# Patient Record
Sex: Female | Born: 1940 | Race: White | Hispanic: No | Smoking: Never smoker
Health system: Southern US, Community
[De-identification: ages and names within clinical notes are randomized; demographics above are authoritative.]

## PROBLEM LIST (undated history)

## (undated) DIAGNOSIS — E119 Type 2 diabetes mellitus without complications: Secondary | ICD-10-CM

## (undated) DIAGNOSIS — D329 Benign neoplasm of meninges, unspecified: Secondary | ICD-10-CM

## (undated) DIAGNOSIS — I2510 Atherosclerotic heart disease of native coronary artery without angina pectoris: Secondary | ICD-10-CM

## (undated) DIAGNOSIS — G4730 Sleep apnea, unspecified: Secondary | ICD-10-CM

## (undated) DIAGNOSIS — G629 Polyneuropathy, unspecified: Secondary | ICD-10-CM

## (undated) DIAGNOSIS — D126 Benign neoplasm of colon, unspecified: Secondary | ICD-10-CM

## (undated) DIAGNOSIS — E785 Hyperlipidemia, unspecified: Secondary | ICD-10-CM

## (undated) DIAGNOSIS — R197 Diarrhea, unspecified: Secondary | ICD-10-CM

## (undated) DIAGNOSIS — D509 Iron deficiency anemia, unspecified: Secondary | ICD-10-CM

## (undated) DIAGNOSIS — K219 Gastro-esophageal reflux disease without esophagitis: Secondary | ICD-10-CM

## (undated) DIAGNOSIS — I472 Ventricular tachycardia: Secondary | ICD-10-CM

## (undated) DIAGNOSIS — L570 Actinic keratosis: Secondary | ICD-10-CM

## (undated) DIAGNOSIS — C801 Malignant (primary) neoplasm, unspecified: Secondary | ICD-10-CM

## (undated) DIAGNOSIS — R1310 Dysphagia, unspecified: Secondary | ICD-10-CM

## (undated) DIAGNOSIS — K279 Peptic ulcer, site unspecified, unspecified as acute or chronic, without hemorrhage or perforation: Secondary | ICD-10-CM

## (undated) DIAGNOSIS — I10 Essential (primary) hypertension: Secondary | ICD-10-CM

## (undated) DIAGNOSIS — M5090 Cervical disc disorder, unspecified, unspecified cervical region: Secondary | ICD-10-CM

## (undated) DIAGNOSIS — I4729 Other ventricular tachycardia: Secondary | ICD-10-CM

## (undated) HISTORY — PX: COLONOSCOPY: SHX174

## (undated) HISTORY — DX: Actinic keratosis: L57.0

## (undated) HISTORY — PX: ABDOMINAL HYSTERECTOMY: SHX81

## (undated) HISTORY — PX: ESOPHAGOGASTRODUODENOSCOPY: SHX1529

---

## 2004-03-29 ENCOUNTER — Ambulatory Visit: Payer: Self-pay | Admitting: Gastroenterology

## 2004-05-02 ENCOUNTER — Ambulatory Visit: Payer: Self-pay | Admitting: Internal Medicine

## 2004-12-20 ENCOUNTER — Ambulatory Visit: Payer: Self-pay | Admitting: Internal Medicine

## 2005-12-26 ENCOUNTER — Ambulatory Visit: Payer: Self-pay | Admitting: Internal Medicine

## 2006-01-04 ENCOUNTER — Ambulatory Visit: Payer: Self-pay | Admitting: Gastroenterology

## 2006-02-19 ENCOUNTER — Ambulatory Visit: Payer: Self-pay | Admitting: Internal Medicine

## 2006-05-26 ENCOUNTER — Ambulatory Visit: Payer: Self-pay | Admitting: Internal Medicine

## 2006-12-31 ENCOUNTER — Ambulatory Visit: Payer: Self-pay | Admitting: Internal Medicine

## 2007-08-17 ENCOUNTER — Ambulatory Visit: Payer: Self-pay | Admitting: Unknown Physician Specialty

## 2008-01-08 ENCOUNTER — Ambulatory Visit: Payer: Self-pay | Admitting: Internal Medicine

## 2008-06-10 ENCOUNTER — Ambulatory Visit: Payer: Self-pay | Admitting: Cardiology

## 2008-08-18 ENCOUNTER — Ambulatory Visit: Payer: Self-pay | Admitting: Gastroenterology

## 2009-01-08 ENCOUNTER — Ambulatory Visit: Payer: Self-pay | Admitting: Internal Medicine

## 2009-02-11 ENCOUNTER — Ambulatory Visit: Payer: Self-pay | Admitting: Gastroenterology

## 2009-05-04 ENCOUNTER — Ambulatory Visit: Payer: Self-pay | Admitting: Internal Medicine

## 2009-05-11 ENCOUNTER — Ambulatory Visit: Payer: Self-pay | Admitting: Gastroenterology

## 2009-12-16 ENCOUNTER — Ambulatory Visit: Payer: Self-pay | Admitting: Ophthalmology

## 2009-12-28 ENCOUNTER — Ambulatory Visit: Payer: Self-pay | Admitting: Ophthalmology

## 2010-01-11 ENCOUNTER — Ambulatory Visit: Payer: Self-pay | Admitting: Internal Medicine

## 2010-01-27 ENCOUNTER — Ambulatory Visit: Payer: Self-pay | Admitting: Ophthalmology

## 2010-02-08 ENCOUNTER — Ambulatory Visit: Payer: Self-pay | Admitting: Ophthalmology

## 2011-02-15 ENCOUNTER — Ambulatory Visit: Payer: Self-pay | Admitting: Internal Medicine

## 2011-09-26 ENCOUNTER — Ambulatory Visit: Payer: Self-pay | Admitting: Internal Medicine

## 2012-01-22 ENCOUNTER — Ambulatory Visit: Payer: Self-pay | Admitting: Internal Medicine

## 2012-01-22 LAB — CREATININE, SERUM: EGFR (Non-African Amer.): 53 — ABNORMAL LOW

## 2012-02-16 ENCOUNTER — Ambulatory Visit: Payer: Self-pay | Admitting: Internal Medicine

## 2012-09-11 ENCOUNTER — Ambulatory Visit: Payer: Self-pay | Admitting: Internal Medicine

## 2013-02-17 ENCOUNTER — Ambulatory Visit: Payer: Self-pay | Admitting: Internal Medicine

## 2013-09-17 DIAGNOSIS — D329 Benign neoplasm of meninges, unspecified: Secondary | ICD-10-CM | POA: Insufficient documentation

## 2013-09-17 DIAGNOSIS — G4733 Obstructive sleep apnea (adult) (pediatric): Secondary | ICD-10-CM | POA: Insufficient documentation

## 2013-10-07 ENCOUNTER — Ambulatory Visit: Payer: Self-pay | Admitting: Gastroenterology

## 2013-10-09 LAB — PATHOLOGY REPORT

## 2013-11-11 ENCOUNTER — Ambulatory Visit: Payer: Self-pay | Admitting: Gastroenterology

## 2014-02-23 ENCOUNTER — Ambulatory Visit: Payer: Self-pay | Admitting: Internal Medicine

## 2015-01-07 ENCOUNTER — Other Ambulatory Visit: Payer: Self-pay | Admitting: Internal Medicine

## 2015-01-07 DIAGNOSIS — Z1231 Encounter for screening mammogram for malignant neoplasm of breast: Secondary | ICD-10-CM

## 2015-03-01 ENCOUNTER — Other Ambulatory Visit: Payer: Self-pay | Admitting: Internal Medicine

## 2015-03-01 ENCOUNTER — Ambulatory Visit
Admission: RE | Admit: 2015-03-01 | Discharge: 2015-03-01 | Disposition: A | Discharge status: Home / Self Care | Payer: Medicare Other | Source: Ambulatory Visit | Attending: Internal Medicine | Admitting: Internal Medicine

## 2015-03-01 DIAGNOSIS — Z1231 Encounter for screening mammogram for malignant neoplasm of breast: Secondary | ICD-10-CM | POA: Diagnosis not present

## 2015-03-01 HISTORY — DX: Malignant (primary) neoplasm, unspecified: C80.1

## 2015-12-20 ENCOUNTER — Other Ambulatory Visit: Payer: Self-pay | Admitting: Internal Medicine

## 2015-12-20 DIAGNOSIS — D329 Benign neoplasm of meninges, unspecified: Secondary | ICD-10-CM

## 2015-12-20 DIAGNOSIS — G450 Vertebro-basilar artery syndrome: Secondary | ICD-10-CM

## 2015-12-29 ENCOUNTER — Ambulatory Visit
Admission: RE | Admit: 2015-12-29 | Discharge: 2015-12-29 | Disposition: A | Discharge status: Home / Self Care | Payer: Medicare Other | Source: Ambulatory Visit | Attending: Internal Medicine | Admitting: Internal Medicine

## 2015-12-29 DIAGNOSIS — I6782 Cerebral ischemia: Secondary | ICD-10-CM | POA: Diagnosis not present

## 2015-12-29 DIAGNOSIS — I608 Other nontraumatic subarachnoid hemorrhage: Secondary | ICD-10-CM | POA: Insufficient documentation

## 2015-12-29 DIAGNOSIS — D329 Benign neoplasm of meninges, unspecified: Secondary | ICD-10-CM | POA: Insufficient documentation

## 2015-12-29 DIAGNOSIS — G450 Vertebro-basilar artery syndrome: Secondary | ICD-10-CM

## 2016-01-19 ENCOUNTER — Other Ambulatory Visit: Payer: Self-pay | Admitting: Otolaryngology

## 2016-01-19 DIAGNOSIS — R1310 Dysphagia, unspecified: Secondary | ICD-10-CM

## 2016-01-26 ENCOUNTER — Ambulatory Visit: Payer: Medicare Other | Admitting: Physical Therapy

## 2016-02-02 ENCOUNTER — Encounter: Payer: Medicare Other | Admitting: Physical Therapy

## 2016-02-03 ENCOUNTER — Ambulatory Visit
Admission: RE | Admit: 2016-02-03 | Discharge: 2016-02-03 | Disposition: A | Discharge status: Home / Self Care | Payer: Medicare Other | Source: Ambulatory Visit | Attending: Otolaryngology | Admitting: Otolaryngology

## 2016-02-03 DIAGNOSIS — R1310 Dysphagia, unspecified: Secondary | ICD-10-CM | POA: Insufficient documentation

## 2016-02-03 NOTE — Therapy (Addendum)
[place], [place], [address] Phone: [phone]   Fax:     Modified Barium Swallow  Patient Details  Name: Brandy Oliver MRN: [id number] Date of Birth: 01/01/1941 No Data Recorded  Encounter Date: 02/03/2016   Subjective: Patient behavior: (alertness, ability to follow instructions, etc.): pt c/o difficulty w/ swallowing saliva stating it felt her throat would "lock up sometimes" the harder she tried to swallow. She stated she had dry mouth "all the time" and addresses that w/ frequent water drinking. She denied any significant issues or events of difficulty swallowing foods or liquids. Pt wears full dentures. Chief complaint: dysphagia   Objective:  Radiological Procedure: A videoflouroscopic evaluation of oral-preparatory, reflex initiation, and pharyngeal phases of the swallow was performed; as well as a screening of the upper esophageal phase.  I. POSTURE: upright II. VIEW: lateral III. COMPENSATORY STRATEGIES: IV. BOLUSES ADMINISTERED:  Thin Liquid: 5 trials(w/ multiple swallowing)  Nectar-thick Liquid: 1 trial  Honey-thick Liquid: NT  Puree: 3 trials  Mechanical Soft: 1 trial V. RESULTS OF EVALUATION: A. ORAL PREPARATORY PHASE: (The lips, tongue, and velum are observed for strength and coordination)       **Overall Severity Rating: WFL. Pt exhibited timely oral phase management and A-P transfer for swallowing; oral clearing noted post swallow.  B. SWALLOW INITIATION/REFLEX: (The reflex is normal if "triggered" by the time the bolus reached the base of the tongue)  **Overall Severity Rating: [facility]. Pt exhibited a timely pharyngeal swallow initiation w/ all trial consistencies. Adequate airway closure timing noted.   C. PHARYNGEAL PHASE: (Pharyngeal function is normal if the bolus shows rapid, smooth, and continuous transit through the pharynx and there is no pharyngeal residue after the  swallow)  **Overall Severity Rating: [facility]. No pharyngeal reside remained post swallows indicating adequate pharyngeal pressure and laryngeal excursion during the swallowing.  D. LARYNGEAL PENETRATION: (Material entering into the laryngeal inlet/vestibule but not aspirated): NONE E. ASPIRATION: NONE F. ESOPHAGEAL PHASE: (Screening of the upper esophagus): Bolus motility through the cervical Esophagus appeared adequate; no residue remained. However, noted min prominent cricopharyngeus muscle w/ what appeared to be the early beginning of a cricopharyngeus bar. This could be resulting from pt's report of the effortful, hard swallowing she does when her mouth is dry.   ASSESSMENT: Pt appeared to present w/  functional oropharyngeal phase swallowing w/ no apparent oropharyngeal phase deficits noted during this exam. Oral phase was appropriate for bolus management and clearing. During the pharyngeal phase, timing of the pharyngeal swallow was appropriate w/ adequate airway closure. No laryngeal penetration or aspiration was noted during this exam, and no pharyngeal residue remained post swallowing.  Of note, pt exhibited what appeared to be a slightly prominent cricopharyngeus muscle w/ early beginning of a cricopharyngeus bar. Pt did state she uses effortful swallowing frequently in attempts to swallow "make myself swallow when I have no saliva". This could impact this presentation.  Thorough education given to pt on oral care and moistening seeking products to aid in moistening her mouth such as Biotene, avoiding heavy mints, frequent oral care, hydration, and possible use of Vaporizer in the bedroom at night while sleeping as pt reports she is a mouth breather. Encouraged her to relax and breath when she feels she becomes tense about her swallowing(of saliva). Strongly suggested pt f/u w/ her Pharmacist to investigate her medications that may be contributing to her dry mouth.    PLAN/RECOMMENDATIONS:  A.  Diet: Regular;  thin liquids. Moisten foods well. Have liquids w/ each meal.   B. Swallowing Precautions: general aspiration precautions.  C. Recommended consultation to: f/u w/ ENT or dentist re: dry mouth; Pharmacist re: medication consultation for any medications increasing potential for dry mouth..   D. Therapy recommendations: none at this time  E. Results and recommendations were discussed w/ pt; video viewed. Recommendations given. Pt able to verbally recall recommendations.      End of Session - 02-28-2016 1458    Visit Number 1   Number of Visits 1   Date for SLP Re-Evaluation February 28, 2016   SLP Start Time 1300   SLP Stop Time  1400   SLP Time Calculation (min) 60 min   Activity Tolerance Patient tolerated treatment well      Past Medical History:  Diagnosis Date  . Cancer ([place])    skin     No past surgical history on file.  There were no vitals filed for this visit.              Dysphagia, unspecified type - Plan: DG OP Swallowing Func-Medicare/Speech Path, DG OP Swallowing Func-Medicare/Speech Path      G-Codes - 2016-02-28 1458    Functional Assessment Tool Used clinical judgement   Functional Limitations Swallowing   Swallow Current Status [id number]) At least 1 percent but less than 20 percent impaired, limited or restricted   Swallow Goal Status [id number]) At least 1 percent but less than 20 percent impaired, limited or restricted   Swallow Discharge Status [phone]) At least 1 percent but less than 20 percent impaired, limited or restricted          Problem List There are no active problems to display for this patient.     Orinda Kenner, [place], CCC-SLP [place] Feb 28, 2016, 2:59 PM  [facility] [place], [place], [address] Phone: [phone]   Fax:     Name: Brandy Oliver MRN: [id number] Date of Birth: [birth year]

## 2016-02-09 ENCOUNTER — Encounter: Payer: Medicare Other | Admitting: Physical Therapy

## 2016-02-14 ENCOUNTER — Other Ambulatory Visit: Payer: Self-pay | Admitting: Internal Medicine

## 2016-02-14 DIAGNOSIS — Z1231 Encounter for screening mammogram for malignant neoplasm of breast: Secondary | ICD-10-CM

## 2016-02-18 ENCOUNTER — Encounter: Payer: Medicare Other | Admitting: Physical Therapy

## 2016-02-25 ENCOUNTER — Encounter: Payer: Medicare Other | Admitting: Physical Therapy

## 2016-03-07 ENCOUNTER — Encounter: Payer: Medicare Other | Admitting: Physical Therapy

## 2016-03-16 ENCOUNTER — Ambulatory Visit
Admission: RE | Admit: 2016-03-16 | Discharge: 2016-03-16 | Disposition: A | Discharge status: Home / Self Care | Payer: Medicare Other | Source: Ambulatory Visit | Attending: Internal Medicine | Admitting: Internal Medicine

## 2016-03-16 DIAGNOSIS — Z1231 Encounter for screening mammogram for malignant neoplasm of breast: Secondary | ICD-10-CM | POA: Insufficient documentation

## 2016-03-17 ENCOUNTER — Encounter: Payer: Medicare Other | Admitting: Physical Therapy

## 2016-07-21 ENCOUNTER — Other Ambulatory Visit: Payer: Self-pay | Admitting: Nurse Practitioner

## 2016-07-21 DIAGNOSIS — R1319 Other dysphagia: Secondary | ICD-10-CM

## 2016-07-26 ENCOUNTER — Other Ambulatory Visit: Payer: Self-pay | Admitting: Nurse Practitioner

## 2016-07-26 ENCOUNTER — Ambulatory Visit: Admission: RE | Admit: 2016-07-26 | Payer: Medicare Other | Source: Ambulatory Visit

## 2016-07-26 ENCOUNTER — Ambulatory Visit
Admission: RE | Admit: 2016-07-26 | Discharge: 2016-07-26 | Disposition: A | Discharge status: Home / Self Care | Payer: Medicare Other | Source: Ambulatory Visit | Attending: Nurse Practitioner | Admitting: Nurse Practitioner

## 2016-07-26 DIAGNOSIS — R1319 Other dysphagia: Secondary | ICD-10-CM | POA: Insufficient documentation

## 2016-07-26 DIAGNOSIS — K219 Gastro-esophageal reflux disease without esophagitis: Secondary | ICD-10-CM | POA: Diagnosis not present

## 2016-11-30 DIAGNOSIS — C4492 Squamous cell carcinoma of skin, unspecified: Secondary | ICD-10-CM

## 2016-11-30 HISTORY — DX: Squamous cell carcinoma of skin, unspecified: C44.92

## 2017-03-07 ENCOUNTER — Other Ambulatory Visit: Payer: Self-pay | Admitting: Internal Medicine

## 2017-03-07 DIAGNOSIS — Z1231 Encounter for screening mammogram for malignant neoplasm of breast: Secondary | ICD-10-CM

## 2017-03-30 ENCOUNTER — Ambulatory Visit
Admission: RE | Admit: 2017-03-30 | Discharge: 2017-03-30 | Disposition: A | Discharge status: Home / Self Care | Payer: Medicare Other | Source: Ambulatory Visit | Attending: Internal Medicine | Admitting: Internal Medicine

## 2017-03-30 DIAGNOSIS — Z1231 Encounter for screening mammogram for malignant neoplasm of breast: Secondary | ICD-10-CM | POA: Diagnosis not present

## 2018-02-26 ENCOUNTER — Other Ambulatory Visit: Payer: Self-pay | Admitting: Internal Medicine

## 2018-02-26 DIAGNOSIS — Z1231 Encounter for screening mammogram for malignant neoplasm of breast: Secondary | ICD-10-CM

## 2018-04-01 ENCOUNTER — Ambulatory Visit
Admission: RE | Admit: 2018-04-01 | Discharge: 2018-04-01 | Disposition: A | Discharge status: Home / Self Care | Payer: Medicare Other | Source: Ambulatory Visit | Attending: Internal Medicine | Admitting: Internal Medicine

## 2018-04-01 DIAGNOSIS — Z1231 Encounter for screening mammogram for malignant neoplasm of breast: Secondary | ICD-10-CM | POA: Insufficient documentation

## 2018-04-21 IMAGING — RF DG SWALLOWING FUNCTION
9 series · 13 of 24 positions shown · non-contrast
Comparison: None.

CLINICAL DATA: 75-year-old female with dry mouth and difficulty
swallowing.

EXAM:
MODIFIED BARIUM SWALLOW
TECHNIQUE: Different consistencies of barium were administered orally to the
patient by the Speech Pathologist. Imaging of the pharynx was
performed in the lateral projection.
FLUOROSCOPY TIME:  Fluoroscopy Time:  30 seconds
Number of Acquired Spot Images: 0

[Series 1: run · 2 of 46 frames shown (1 of 9)]
[frame 7/46]
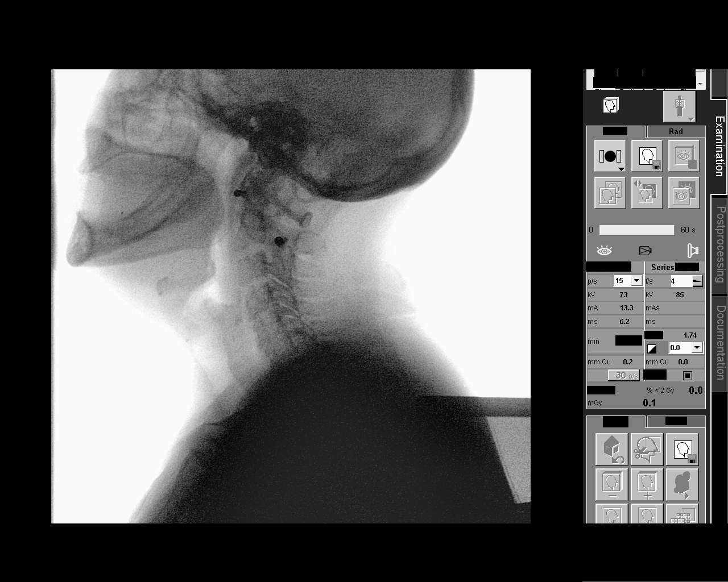
[frame 40/46]
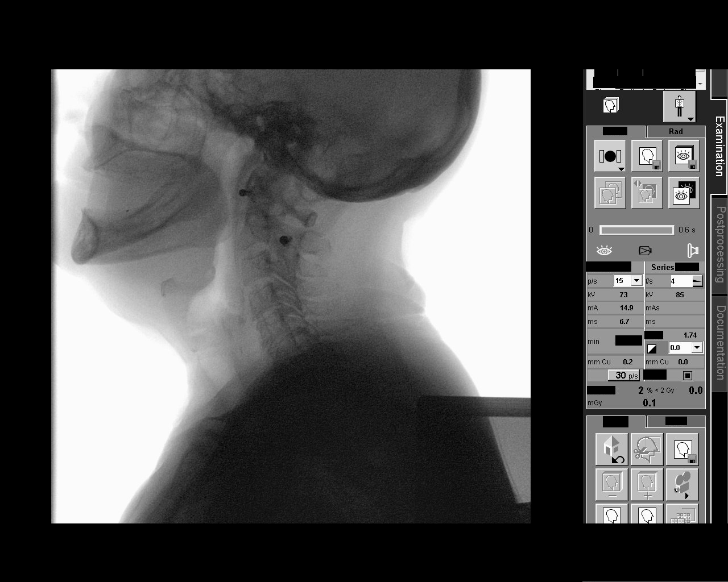

[Series 2: run · 1 of 41 frames shown (2 of 9)]
[frame 35/41]
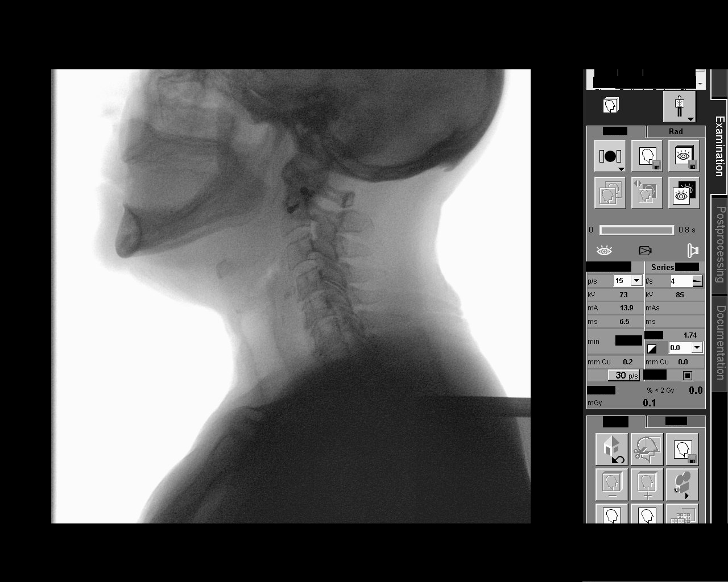

[Series 3: run · 1 of 97 frames shown (3 of 9)]
[frame 49/97]
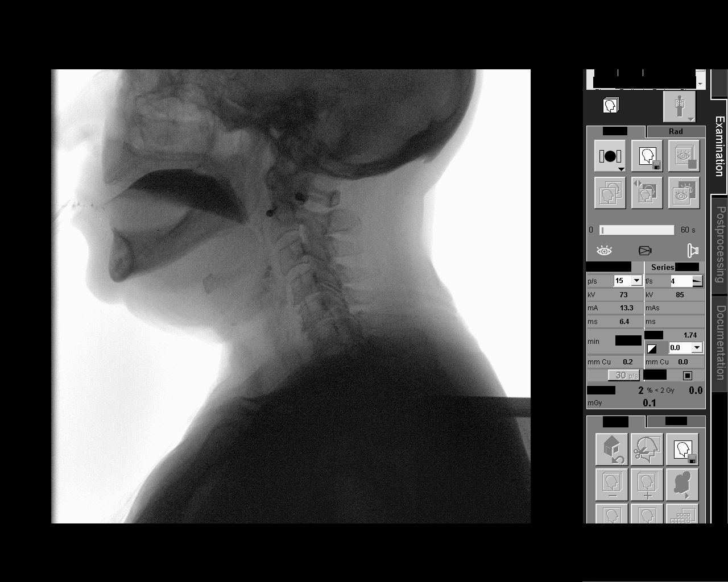

[Series 4: run · 2 of 30 frames shown (4 of 9)]
[frame 5/30]
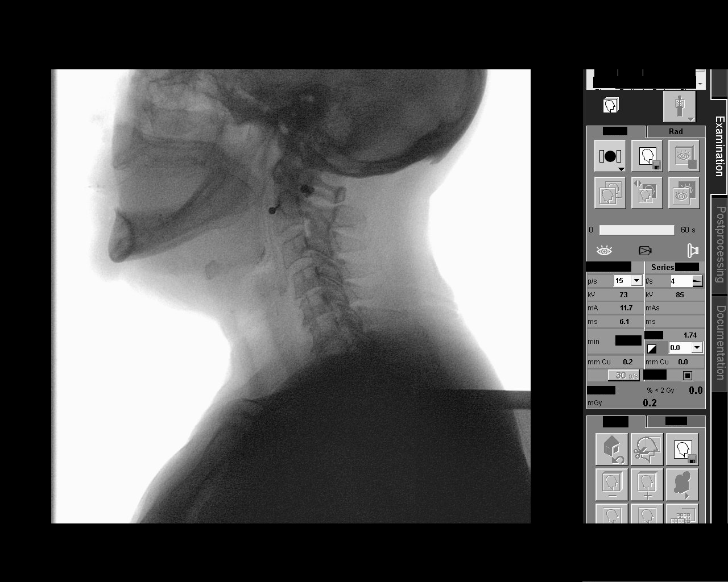
[frame 26/30]
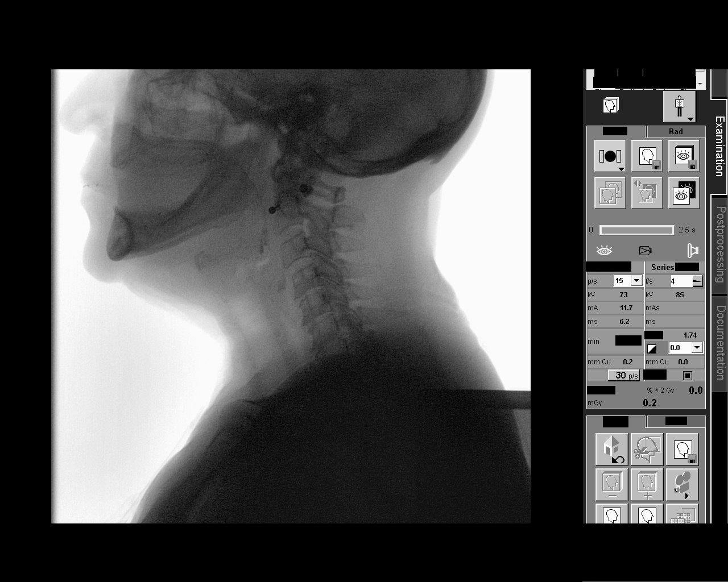

[Series 5: run · 1 of 34 frames shown (5 of 9)]
[frame 21/34]
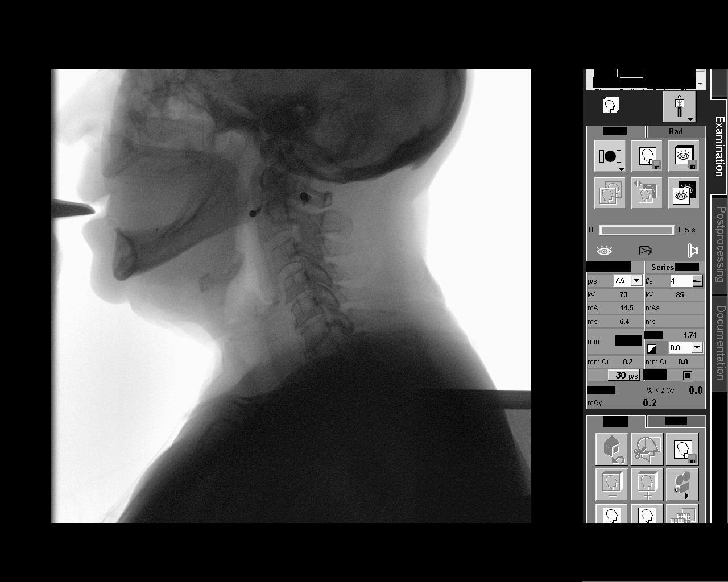

[Series 6: run · 2 of 83 frames shown (6 of 9)]
[frame 13/83]
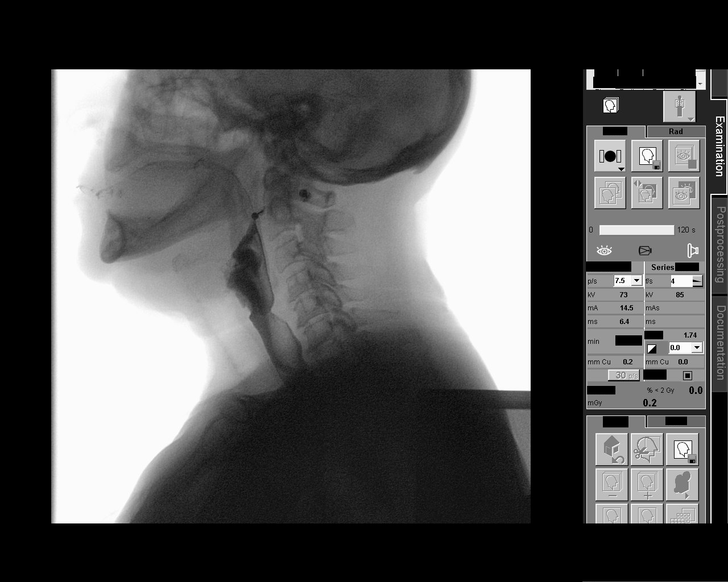
[frame 71/83]
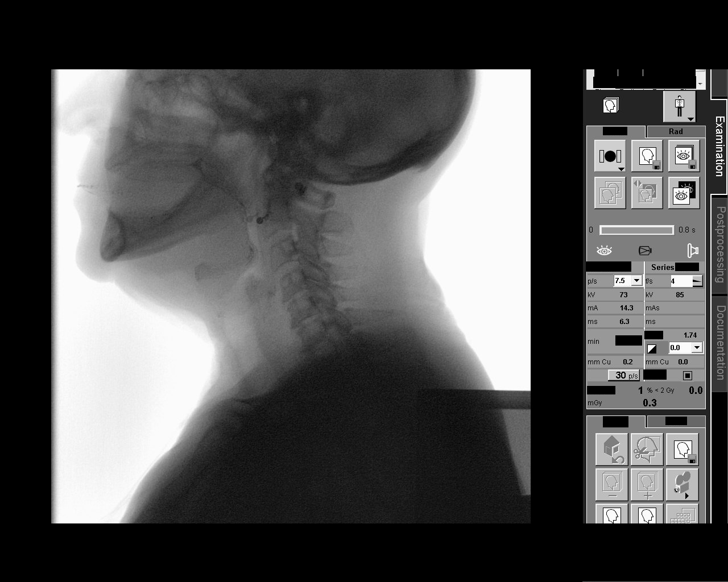

[Series 7: run · 1 of 134 frames shown (7 of 9)]
[frame 68/134]
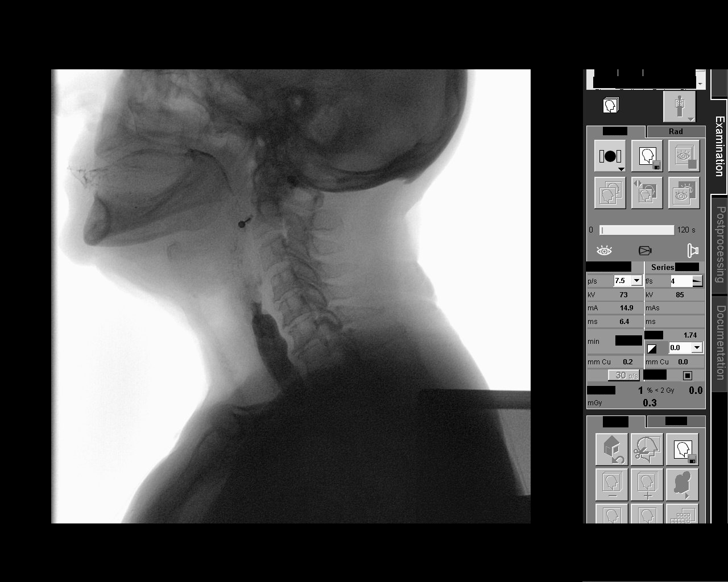

[Series 8: run · 1 of 106 frames shown (8 of 9)]
[frame 29/106]
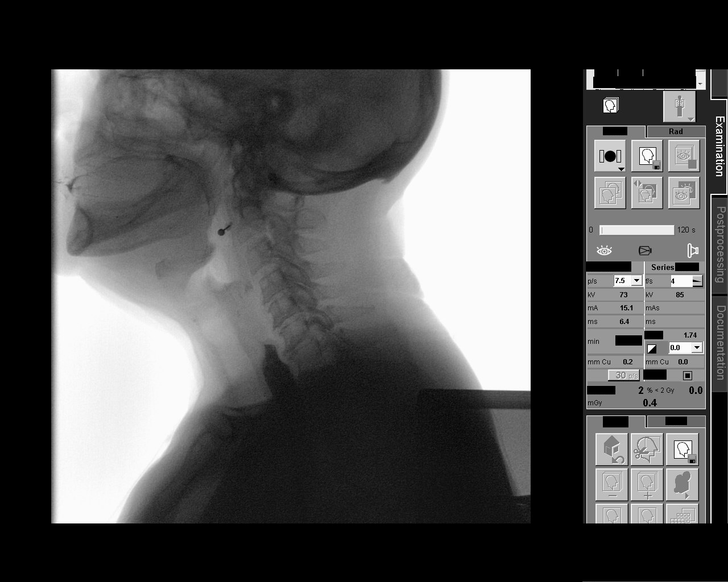

[Series 9: run · 2 of 296 frames shown (9 of 9)]
[frame 45/296]
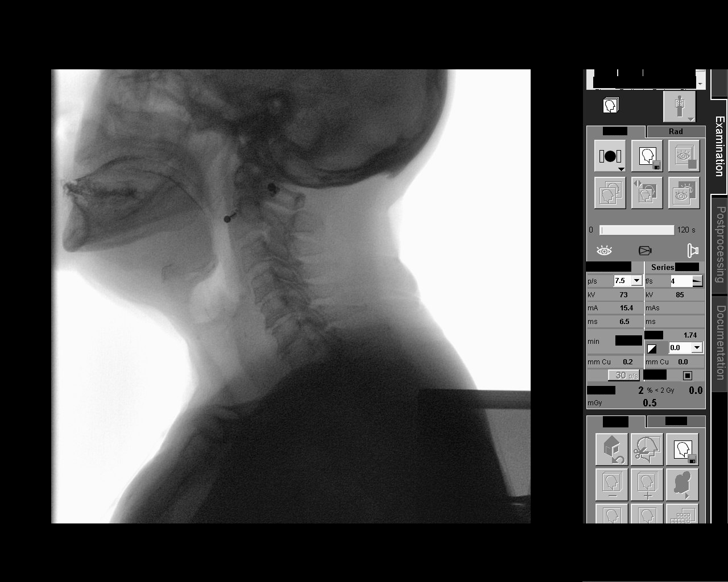
[frame 252/296]
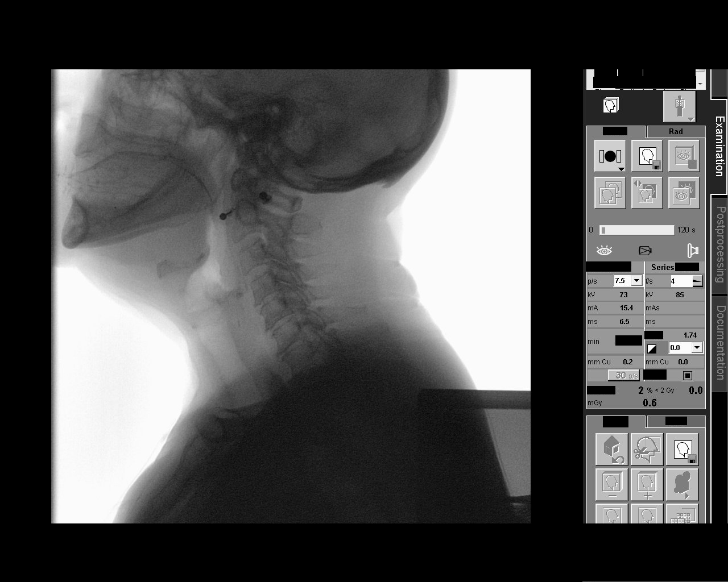

[13 of 24 positions shown; findings below may reference images not displayed]

FINDINGS: Thin liquid- no laryngeal penetration or aspiration. Slightly
prominent cricopharyngeal muscle. Minimal impression posterior
aspect of the cervical esophagus by C6-7 osteophyte.

Nectar thick liquid- no laryngeal penetration or aspiration.
Slightly prominent cricopharyngeal muscle. Minimal impression
posterior aspect of the cervical esophagus by C6-7 osteophyte.

Stenroth?Denis Ariel with cracker- within normal limits
IMPRESSION: No laryngeal penetration or aspiration.

Slightly prominent cricopharyngeal muscle.

Minimal impression posterior aspect of the cervical esophagus by
C6-7 osteophyte.

Please refer to the Speech Pathologists report for complete details
and recommendations.

## 2018-04-22 ENCOUNTER — Other Ambulatory Visit
Admission: RE | Admit: 2018-04-22 | Discharge: 2018-04-22 | Disposition: A | Discharge status: Home / Self Care | Payer: Medicare Other | Source: Ambulatory Visit | Attending: Nurse Practitioner | Admitting: Nurse Practitioner

## 2018-04-22 DIAGNOSIS — R197 Diarrhea, unspecified: Secondary | ICD-10-CM | POA: Insufficient documentation

## 2018-04-22 LAB — GASTROINTESTINAL PANEL BY PCR, STOOL (REPLACES STOOL CULTURE)

## 2018-04-22 LAB — C DIFFICILE QUICK SCREEN W PCR REFLEX
C Diff antigen: NEGATIVE
C Diff interpretation: NOT DETECTED
C Diff toxin: NEGATIVE

## 2018-06-10 ENCOUNTER — Ambulatory Visit: Payer: Medicare Other | Admitting: Anesthesiology

## 2018-06-10 ENCOUNTER — Ambulatory Visit
Admission: RE | Admit: 2018-06-10 | Discharge: 2018-06-10 | Disposition: A | Discharge status: Home / Self Care | Payer: Medicare Other | Attending: Unknown Physician Specialty | Admitting: Unknown Physician Specialty

## 2018-06-10 ENCOUNTER — Other Ambulatory Visit: Payer: Self-pay

## 2018-06-10 ENCOUNTER — Encounter: Payer: Self-pay | Admitting: *Deleted

## 2018-06-10 ENCOUNTER — Encounter
Admission: RE | Disposition: A | Discharge status: Home / Self Care | Payer: Self-pay | Source: Home / Self Care | Attending: Unknown Physician Specialty

## 2018-06-10 DIAGNOSIS — I2510 Atherosclerotic heart disease of native coronary artery without angina pectoris: Secondary | ICD-10-CM | POA: Diagnosis not present

## 2018-06-10 DIAGNOSIS — R197 Diarrhea, unspecified: Secondary | ICD-10-CM | POA: Insufficient documentation

## 2018-06-10 DIAGNOSIS — K2950 Unspecified chronic gastritis without bleeding: Secondary | ICD-10-CM | POA: Insufficient documentation

## 2018-06-10 DIAGNOSIS — Z7984 Long term (current) use of oral hypoglycemic drugs: Secondary | ICD-10-CM | POA: Diagnosis not present

## 2018-06-10 DIAGNOSIS — D123 Benign neoplasm of transverse colon: Secondary | ICD-10-CM | POA: Diagnosis not present

## 2018-06-10 DIAGNOSIS — K219 Gastro-esophageal reflux disease without esophagitis: Secondary | ICD-10-CM | POA: Insufficient documentation

## 2018-06-10 DIAGNOSIS — I10 Essential (primary) hypertension: Secondary | ICD-10-CM | POA: Insufficient documentation

## 2018-06-10 DIAGNOSIS — D509 Iron deficiency anemia, unspecified: Secondary | ICD-10-CM | POA: Insufficient documentation

## 2018-06-10 DIAGNOSIS — E1140 Type 2 diabetes mellitus with diabetic neuropathy, unspecified: Secondary | ICD-10-CM | POA: Insufficient documentation

## 2018-06-10 DIAGNOSIS — G4730 Sleep apnea, unspecified: Secondary | ICD-10-CM | POA: Insufficient documentation

## 2018-06-10 DIAGNOSIS — K640 First degree hemorrhoids: Secondary | ICD-10-CM | POA: Insufficient documentation

## 2018-06-10 DIAGNOSIS — K3189 Other diseases of stomach and duodenum: Secondary | ICD-10-CM | POA: Diagnosis not present

## 2018-06-10 DIAGNOSIS — Z880 Allergy status to penicillin: Secondary | ICD-10-CM | POA: Insufficient documentation

## 2018-06-10 DIAGNOSIS — Z800 Family history of malignant neoplasm of digestive organs: Secondary | ICD-10-CM | POA: Diagnosis present

## 2018-06-10 DIAGNOSIS — E785 Hyperlipidemia, unspecified: Secondary | ICD-10-CM | POA: Diagnosis not present

## 2018-06-10 DIAGNOSIS — R1310 Dysphagia, unspecified: Secondary | ICD-10-CM | POA: Diagnosis not present

## 2018-06-10 DIAGNOSIS — Z79899 Other long term (current) drug therapy: Secondary | ICD-10-CM | POA: Diagnosis not present

## 2018-06-10 DIAGNOSIS — D122 Benign neoplasm of ascending colon: Secondary | ICD-10-CM | POA: Diagnosis not present

## 2018-06-10 HISTORY — DX: Ventricular tachycardia: I47.2

## 2018-06-10 HISTORY — DX: Type 2 diabetes mellitus without complications: E11.9

## 2018-06-10 HISTORY — DX: Polyneuropathy, unspecified: G62.9

## 2018-06-10 HISTORY — DX: Sleep apnea, unspecified: G47.30

## 2018-06-10 HISTORY — DX: Peptic ulcer, site unspecified, unspecified as acute or chronic, without hemorrhage or perforation: K27.9

## 2018-06-10 HISTORY — DX: Hyperlipidemia, unspecified: E78.5

## 2018-06-10 HISTORY — DX: Iron deficiency anemia, unspecified: D50.9

## 2018-06-10 HISTORY — DX: Gastro-esophageal reflux disease without esophagitis: K21.9

## 2018-06-10 HISTORY — PX: ESOPHAGOGASTRODUODENOSCOPY: SHX5428

## 2018-06-10 HISTORY — DX: Dysphagia, unspecified: R13.10

## 2018-06-10 HISTORY — DX: Benign neoplasm of colon, unspecified: D12.6

## 2018-06-10 HISTORY — DX: Diarrhea, unspecified: R19.7

## 2018-06-10 HISTORY — DX: Atherosclerotic heart disease of native coronary artery without angina pectoris: I25.10

## 2018-06-10 HISTORY — DX: Other ventricular tachycardia: I47.29

## 2018-06-10 HISTORY — PX: COLONOSCOPY WITH PROPOFOL: SHX5780

## 2018-06-10 HISTORY — DX: Essential (primary) hypertension: I10

## 2018-06-10 HISTORY — DX: Cervical disc disorder, unspecified, unspecified cervical region: M50.90

## 2018-06-10 HISTORY — DX: Benign neoplasm of meninges, unspecified: D32.9

## 2018-06-10 LAB — GLUCOSE, CAPILLARY: Glucose-Capillary: 161 mg/dL — ABNORMAL HIGH (ref 70–99)

## 2018-06-10 SURGERY — EGD (ESOPHAGOGASTRODUODENOSCOPY)
Anesthesia: General

## 2018-06-10 MED ORDER — BUTAMBEN-TETRACAINE-BENZOCAINE 2-2-14 % EX AERO
INHALATION_SPRAY | CUTANEOUS | Status: AC
  Filled 2018-06-10: qty 5

## 2018-06-10 MED ORDER — FENTANYL CITRATE (PF) 100 MCG/2ML IJ SOLN
INTRAMUSCULAR | Status: DC | PRN
  Administered 2018-06-10: 25 ug via INTRAVENOUS
  Administered 2018-06-10: 50 ug via INTRAVENOUS
  Administered 2018-06-10: 25 ug via INTRAVENOUS

## 2018-06-10 MED ORDER — MIDAZOLAM HCL 2 MG/2ML IJ SOLN
INTRAMUSCULAR | Status: DC | PRN
  Administered 2018-06-10: 1 mg via INTRAVENOUS

## 2018-06-10 MED ORDER — LIDOCAINE HCL (PF) 2 % IJ SOLN
INTRAMUSCULAR | Status: AC
  Filled 2018-06-10: qty 10

## 2018-06-10 MED ORDER — SODIUM CHLORIDE 0.9 % IV SOLN
INTRAVENOUS | Status: DC | PRN
  Administered 2018-06-10: 08:00:00 via INTRAVENOUS

## 2018-06-10 MED ORDER — SODIUM CHLORIDE 0.9 % IV SOLN: INTRAVENOUS | Status: DC

## 2018-06-10 MED ORDER — LIDOCAINE HCL (CARDIAC) PF 100 MG/5ML IV SOSY
PREFILLED_SYRINGE | INTRAVENOUS | Status: DC | PRN
  Administered 2018-06-10: 50 mg via INTRAVENOUS

## 2018-06-10 MED ORDER — MIDAZOLAM HCL 2 MG/2ML IJ SOLN
INTRAMUSCULAR | Status: AC
  Filled 2018-06-10: qty 2

## 2018-06-10 MED ORDER — FENTANYL CITRATE (PF) 100 MCG/2ML IJ SOLN
INTRAMUSCULAR | Status: AC
  Filled 2018-06-10: qty 2

## 2018-06-10 MED ORDER — PROPOFOL 500 MG/50ML IV EMUL
INTRAVENOUS | Status: DC | PRN
  Administered 2018-06-10: 120 ug/kg/min via INTRAVENOUS

## 2018-06-10 MED ORDER — SODIUM CHLORIDE 0.9 % IV SOLN
INTRAVENOUS | Status: DC
  Administered 2018-06-10: 1000 mL via INTRAVENOUS

## 2018-06-10 MED ORDER — PROPOFOL 500 MG/50ML IV EMUL
INTRAVENOUS | Status: AC
  Filled 2018-06-10: qty 50

## 2018-06-10 NOTE — Anesthesia Preprocedure Evaluation (Signed)
Anesthesia Evaluation  Patient identified by MRN, date of birth, ID band Patient awake    Reviewed: Allergy & Precautions, H&P , NPO status , Patient's Chart, lab work & pertinent test results, reviewed documented beta blocker date and time   Airway Mallampati: II  TM Distance: >3 FB Neck ROM: full    Dental  (+) Edentulous Upper, Edentulous Lower, Upper Dentures, Lower Dentures, Dental Advidsory Given   Pulmonary neg shortness of breath, sleep apnea , neg COPD, neg recent URI,           Cardiovascular Exercise Tolerance: Good hypertension, (-) angina+ CAD  (-) Past MI, (-) Cardiac Stents and (-) CABG + dysrhythmias Supra Ventricular Tachycardia (-) Valvular Problems/Murmurs     Neuro/Psych negative neurological ROS  negative psych ROS   GI/Hepatic Neg liver ROS, PUD, GERD  ,  Endo/Other  diabetes  Renal/GU negative Renal ROS  negative genitourinary   Musculoskeletal   Abdominal   Peds  Hematology  (+) Blood dyscrasia, anemia ,   Anesthesia Other Findings Past Medical History: No date: Cancer ([place])     Comment:  skin  No date: Cervical disc disease No date: Colon adenomas No date: Coronary artery disease No date: Diabetes mellitus without complication (HCC) No date: Diarrhea No date: Dysphagia No date: GERD (gastroesophageal reflux disease) No date: Hyperlipidemia No date: Hypertension No date: IDA (iron deficiency anemia) No date: Meningioma (HCC) No date: Neuropathy No date: NSVT (nonsustained ventricular tachycardia) (HCC) No date: PUD (peptic ulcer disease) No date: Sleep apnea   Reproductive/Obstetrics negative OB ROS                             Anesthesia Physical Anesthesia Plan  ASA: III  Anesthesia Plan: General   Post-op Pain Management:    Induction: Intravenous  PONV Risk Score and Plan: 3 and Propofol infusion and TIVA  Airway Management Planned: Natural  Airway and Nasal Cannula  Additional Equipment:   Intra-op Plan:   Post-operative Plan:   Informed Consent: I have reviewed the patients History and Physical, chart, labs and discussed the procedure including the risks, benefits and alternatives for the proposed anesthesia with the patient or authorized representative who has indicated his/her understanding and acceptance.     Dental Advisory Given  Plan Discussed with: Anesthesiologist, CRNA and Surgeon  Anesthesia Plan Comments:         Anesthesia Quick Evaluation

## 2018-06-10 NOTE — Op Note (Signed)
[facility] Gastroenterology Patient Name: Brandy Oliver Procedure Date: 06/10/2018 7:17 AM MRN: [id number] Account #: [id number] Date of Birth: [birth year] Admit Type: Outpatient Age: 78 Room: [facility] ENDO ROOM 1 Gender: Female Note Status: Finalized Procedure:            Upper GI endoscopy Indications:          Dysphagia, Heartburn, Follow-up of gastro-esophageal                        reflux disease Providers:            Manya Silvas, MD Referring MD:         Rusty Aus, MD (Referring MD) Medicines:            Propofol per Anesthesia Complications:        No immediate complications. Procedure:            Pre-Anesthesia Assessment:                       - After reviewing the risks and benefits, the patient                        was deemed in satisfactory condition to undergo the                        procedure.                       After obtaining informed consent, the endoscope was                        passed under direct vision. Throughout the procedure,                        the patient's blood pressure, pulse, and oxygen                        saturations were monitored continuously. The Endoscope                        was introduced through the mouth, and advanced to the                        second part of duodenum. The upper GI endoscopy was                        accomplished without difficulty. The patient tolerated                        the procedure well. Findings:      The lumen of the esophagus was mildly dilated. GEJ 40cm. After the end       of the exam I did a Savary dilatation to 61F.      Small polyp/nodule at gastric side of GEJ.      Patchy mild inflammation characterized by erythema and granularity was       found in the gastric body and in the gastric antrum. Biopsies were taken       with a cold forceps for histology. Biopsies were taken with a cold       forceps for Helicobacter pylori testing. Impression:           -  Dilation in the entire esophagus.                       - Gastritis. Biopsied. Recommendation:       - Await pathology results. Manya Silvas, MD 06/10/2018 8:00:04 AM This report has been signed electronically. Number of Addenda: 0 Note Initiated On: 06/10/2018 7:17 AM      [facility]

## 2018-06-10 NOTE — Op Note (Signed)
[facility] Gastroenterology Patient Name: Brandy Oliver Procedure Date: 06/10/2018 7:16 AM MRN: [id number] Account #: [id number] Date of Birth: May 08, 1940 Admit Type: Outpatient Age: 78 Room: [facility] ENDO ROOM 1 Gender: Female Note Status: Finalized Procedure:            Colonoscopy Indications:          Family history of colon cancer in a first-degree                        relative Providers:            Manya Silvas, MD Referring MD:         Rusty Aus, MD (Referring MD) Medicines:            Propofol per Anesthesia Complications:        No immediate complications. Procedure:            Pre-Anesthesia Assessment:                       - After reviewing the risks and benefits, the patient                        was deemed in satisfactory condition to undergo the                        procedure.                       After obtaining informed consent, the colonoscope was                        passed under direct vision. Throughout the procedure,                        the patient's blood pressure, pulse, and oxygen                        saturations were monitored continuously. The was                        introduced through the anus and advanced to the the                        cecum, identified by appendiceal orifice and ileocecal                        valve. The colonoscopy was performed without                        difficulty. The patient tolerated the procedure well.                        The quality of the bowel preparation was good. Findings:      Two sessile polyps were found in the ascending colon. The polyps were       diminutive in size. These polyps were removed with a hot snare.       Resection and retrieval were complete.      A small polyp was found in the distal ascending colon. The polyp was       sessile. The polyp was removed with a hot snare.  Resection and retrieval       were complete.      A small polyp was found in the  transverse colon. The polyp was sessile.       The polyp was removed with a hot snare. Resection and retrieval were       complete.      Internal hemorrhoids were found during endoscopy. The hemorrhoids were       small and Grade I (internal hemorrhoids that do not prolapse).      The exam was otherwise without abnormality. Impression:           - Two diminutive polyps in the ascending colon, removed                        with a hot snare. Resected and retrieved.                       - One small polyp in the distal ascending colon,                        removed with a hot snare. Resected and retrieved.                       - One small polyp in the transverse colon, removed with                        a hot snare. Resected and retrieved.                       - Internal hemorrhoids.                       - The examination was otherwise normal. Recommendation:       - Await pathology results. Manya Silvas, MD 06/10/2018 8:28:55 AM This report has been signed electronically. Number of Addenda: 0 Note Initiated On: 06/10/2018 7:16 AM Scope Withdrawal Time: 0 hours 13 minutes 30 seconds  Total Procedure Duration: 0 hours 21 minutes 5 seconds       [facility]

## 2018-06-10 NOTE — Anesthesia Post-op Follow-up Note (Signed)
Anesthesia QCDR form completed.        

## 2018-06-10 NOTE — Transfer of Care (Signed)
Immediate Anesthesia Transfer of Care Note  Patient: Brandy Oliver  Procedure(s) Performed: ESOPHAGOGASTRODUODENOSCOPY (EGD) (N/A ) COLONOSCOPY WITH PROPOFOL (N/A )  Patient Location: PACU  Anesthesia Type:General  Level of Consciousness: awake and sedated  Airway & Oxygen Therapy: Patient Spontanous Breathing and Patient connected to nasal cannula oxygen  Post-op Assessment: Report given to RN and Post -op Vital signs reviewed and stable  Post vital signs: Reviewed and stable  Last Vitals:  Vitals Value Taken Time  BP    Temp    Pulse    Resp    SpO2      Last Pain:  Vitals:   06/10/18 0714  TempSrc: Tympanic  PainSc: 0-No pain         Complications: No apparent anesthesia complications

## 2018-06-10 NOTE — Anesthesia Postprocedure Evaluation (Signed)
Anesthesia Post Note  Patient: Brandy Oliver  Procedure(s) Performed: ESOPHAGOGASTRODUODENOSCOPY (EGD) (N/A ) COLONOSCOPY WITH PROPOFOL (N/A )  Patient location during evaluation: Endoscopy Anesthesia Type: General Level of consciousness: awake and alert Pain management: pain level controlled Vital Signs Assessment: post-procedure vital signs reviewed and stable Respiratory status: spontaneous breathing, nonlabored ventilation, respiratory function stable and patient connected to nasal cannula oxygen Cardiovascular status: blood pressure returned to baseline and stable Postop Assessment: no apparent nausea or vomiting Anesthetic complications: no     Last Vitals:  Vitals:   06/10/18 0846 06/10/18 0856  BP: 135/74 (!) 142/67  Pulse: 81 83  Resp: 16 16  Temp:    SpO2: 100% 98%    Last Pain:  Vitals:   06/10/18 0856  TempSrc:   PainSc: 0-No pain                 Martha Clan

## 2018-06-10 NOTE — H&P (Signed)
Primary Care Physician:  Rusty Aus, MD Primary Gastroenterologist:  Dr. Vira Agar  Pre-Procedure History & Physical: HPI:  Brandy Oliver is a 78 y.o. female is here for an endoscopy and colonoscopy.  Due to dysphagia, GERD, FH colon cancer in mother, iron def anemia. Diarrhea.   Past Medical History:  Diagnosis Date  . Cancer ([place])    skin   . Cervical disc disease   . Colon adenomas   . Coronary artery disease   . Diabetes mellitus without complication ([place])   . Diarrhea   . Dysphagia   . GERD (gastroesophageal reflux disease)   . Hyperlipidemia   . Hypertension   . IDA (iron deficiency anemia)   . Meningioma ([place])   . Neuropathy   . NSVT (nonsustained ventricular tachycardia) ([place])   . PUD (peptic ulcer disease)   . Sleep apnea     Past Surgical History:  Procedure Laterality Date  . ABDOMINAL HYSTERECTOMY    . COLONOSCOPY    . ESOPHAGOGASTRODUODENOSCOPY      Prior to Admission medications   Medication Sig Start Date End Date Taking? Authorizing Provider  ALPRAZolam Duanne Moron) 0.5 MG tablet Take 0.5 mg by mouth at bedtime as needed for anxiety.   Yes [provider]  baclofen (LIORESAL) 10 MG tablet Take 10 mg by mouth 2 (two) times daily.   Yes [provider]  calcium-vitamin D (OSCAL WITH D) 500-200 MG-UNIT tablet Take 1 tablet by mouth 2 (two) times daily.   Yes [provider]  citalopram (CELEXA) 10 MG tablet Take 10 mg by mouth daily.   Yes [provider]  cyclobenzaprine (FLEXERIL) 10 MG tablet Take 10 mg by mouth 3 (three) times daily as needed for muscle spasms.   Yes [provider]  glimepiride (AMARYL) 4 MG tablet Take 4 mg by mouth daily with breakfast.   Yes [provider]  Iron-Vitamin C (VITRON-C PO) Take by mouth 3 (three) times a week.   Yes [provider]  losartan-hydrochlorothiazide (HYZAAR) 100-12.5 MG tablet Take 1 tablet by mouth daily.   Yes [provider]   meclizine (ANTIVERT) 25 MG tablet Take 25 mg by mouth 3 (three) times daily as needed for dizziness.   Yes [provider]  metFORMIN (GLUCOPHAGE) 500 MG tablet Take by mouth 2 (two) times daily with a meal.   Yes [provider]  omeprazole (PRILOSEC) 20 MG capsule Take 20 mg by mouth 2 (two) times daily before a meal.   Yes [provider]  pravastatin (PRAVACHOL) 40 MG tablet Take 40 mg by mouth daily.   Yes [provider]  traZODone (DESYREL) 50 MG tablet Take 50 mg by mouth at bedtime.   Yes [provider]  verapamil (CALAN-SR) 180 MG CR tablet Take 180 mg by mouth at bedtime.   Yes [provider]  vitamin B-12 (CYANOCOBALAMIN) 500 MCG tablet Take 500 mcg by mouth daily.   Yes [provider]  zolpidem (AMBIEN) 10 MG tablet Take 10 mg by mouth at bedtime as needed for sleep.   Yes [provider]    Allergies as of 04/19/2018  . (Not on File)    Family History  Problem Relation Age of Onset  . Breast cancer Neg Hx     Social History   Socioeconomic History  . Marital status: Widowed    Spouse name: Not on file  . Number of children: Not on file  . Years of education:  Not on file  . Highest education level: Not on file  Occupational History  . Not on file  Social Needs  . Financial resource strain: Not on file  . Food insecurity:    Worry: Not on file    Inability: Not on file  . Transportation needs:    Medical: Not on file    Non-medical: Not on file  Tobacco Use  . Smoking status: Never Smoker  . Smokeless tobacco: Never Used  Substance and Sexual Activity  . Alcohol use: Never    Frequency: Never  . Drug use: Never  . Sexual activity: Not on file  Lifestyle  . Physical activity:    Days per week: Not on file    Minutes per session: Not on file  . Stress: Not on file  Relationships  . Social connections:    Talks on phone: Not on file    Gets together: Not on file    Attends  religious service: Not on file    Active member of club or organization: Not on file    Attends meetings of clubs or organizations: Not on file    Relationship status: Not on file  . Intimate partner violence:    Fear of current or ex partner: Not on file    Emotionally abused: Not on file    Physically abused: Not on file    Forced sexual activity: Not on file  Other Topics Concern  . Not on file  Social History Narrative  . Not on file    Review of Systems: See HPI, otherwise negative ROS  Physical Exam: BP (!) 153/75   Pulse 75   Temp (!) 96.6 F (35.9 C) (Tympanic)   Resp 16   Ht 5\' 6"  (1.676 m)   Wt 63.5 kg   SpO2 100%   BMI 22.60 kg/m  General:   Alert,  pleasant and cooperative in NAD Head:  Normocephalic and atraumatic. Neck:  Supple; no masses or thyromegaly. Lungs:  Clear throughout to auscultation.    Heart:  Regular rate and rhythm. Abdomen:  Soft, nontender and nondistended. Normal bowel sounds, without guarding, and without rebound.   Neurologic:  Alert and  oriented x4;  grossly normal neurologically.  Impression/Plan: Brandy Oliver is here for an endoscopy and colonoscopy to be performed for dysphagia, GERD, FH colon cancer in mother, iron def anemia.  Risks, benefits, limitations, and alternatives regarding  endoscopy and colonoscopy have been reviewed with the patient.  Questions have been answered.  All parties agreeable.   Gaylyn Cheers, MD  06/10/2018, 7:30 AM

## 2018-06-10 NOTE — Anesthesia Procedure Notes (Signed)
Performed by: Cook-Martin, Codee Bloodworth Pre-anesthesia Checklist: Patient identified, Emergency Drugs available, Suction available, Patient being monitored and Timeout performed Patient Re-evaluated:Patient Re-evaluated prior to induction Oxygen Delivery Method: Nasal cannula Preoxygenation: Pre-oxygenation with 100% oxygen Induction Type: IV induction Airway Equipment and Method: Bite block Placement Confirmation: positive ETCO2 and CO2 detector       

## 2018-06-12 ENCOUNTER — Encounter: Payer: Self-pay | Admitting: Unknown Physician Specialty

## 2018-06-12 LAB — SURGICAL PATHOLOGY

## 2019-10-30 ENCOUNTER — Other Ambulatory Visit: Payer: Self-pay

## 2019-10-30 ENCOUNTER — Ambulatory Visit: Payer: Medicare Other | Admitting: Dermatology

## 2019-10-30 DIAGNOSIS — L820 Inflamed seborrheic keratosis: Secondary | ICD-10-CM

## 2019-10-30 DIAGNOSIS — D0439 Carcinoma in situ of skin of other parts of face: Secondary | ICD-10-CM | POA: Diagnosis not present

## 2019-10-30 DIAGNOSIS — L578 Other skin changes due to chronic exposure to nonionizing radiation: Secondary | ICD-10-CM | POA: Diagnosis not present

## 2019-10-30 DIAGNOSIS — L821 Other seborrheic keratosis: Secondary | ICD-10-CM

## 2019-10-30 DIAGNOSIS — L570 Actinic keratosis: Secondary | ICD-10-CM | POA: Diagnosis not present

## 2019-10-30 DIAGNOSIS — D485 Neoplasm of uncertain behavior of skin: Secondary | ICD-10-CM

## 2019-10-30 NOTE — Patient Instructions (Signed)

## 2019-10-30 NOTE — Progress Notes (Signed)
Follow-Up Visit   Subjective  Brandy Oliver is a 79 y.o. female who presents for the following: lesion (L chin - has been there for about a year, crusted, scabbed), lesion (L mandible - new and crusted for a few months now), and lesion (back - itchy and irritated ).  The following portions of the chart were reviewed this encounter and updated as appropriate:  Tobacco  Allergies  Meds  Problems  Med Hx  Surg Hx  Fam Hx     Review of Systems:  No other skin or systemic complaints except as noted in HPI or Assessment and Plan.  Objective  Well appearing patient in no apparent distress; mood and affect are within normal limits.  A focused examination was performed including the face and back. Relevant physical exam findings are noted in the Assessment and Plan.  Objective  L lat chin: 0.5 cm crusted papule   Objective  L mandible x 2, back x 2, R cheek x 1 (5): Erythematous keratotic or waxy stuck-on papule or plaque.   Objective  R zygoma x 1, R nose x 2 (3): Erythematous thin papules/macules with gritty scale.   Assessment & Plan    Neoplasm of uncertain behavior of skin L lat chin  Skin / nail biopsy Type of biopsy: tangential   Informed consent: discussed and consent obtained   Timeout: patient name, date of birth, surgical site, and procedure verified   Procedure prep:  Patient was prepped and draped in usual sterile fashion Prep type:  Isopropyl alcohol Anesthesia: the lesion was anesthetized in a standard fashion   Anesthetic:  1% lidocaine w/ epinephrine 1-100,000 buffered w/ 8.4% NaHCO3 Instrument used: flexible razor blade   Hemostasis achieved with: pressure, aluminum chloride and electrodesiccation   Outcome: patient tolerated procedure well   Post-procedure details: sterile dressing applied and wound care instructions given   Dressing type: bandage and petrolatum    Specimen 1 - Surgical pathology Differential Diagnosis: D48.5 r/o BCC Check  Margins: No 0.5 cm crusted papule    Inflamed seborrheic keratosis (5) L mandible x 2, back x 2, R cheek x 1  Destruction of lesion - L mandible x 2, back x 2, R cheek x 1 Complexity: simple   Destruction method: cryotherapy   Informed consent: discussed and consent obtained   Timeout:  patient name, date of birth, surgical site, and procedure verified Lesion destroyed using liquid nitrogen: Yes   Region frozen until ice ball extended beyond lesion: Yes   Outcome: patient tolerated procedure well with no complications   Post-procedure details: wound care instructions given    AK (actinic keratosis) (3) R zygoma x 1, R nose x 2  Destruction of lesion - R zygoma x 1, R nose x 2 Complexity: simple   Destruction method: cryotherapy   Informed consent: discussed and consent obtained   Timeout:  patient name, date of birth, surgical site, and procedure verified Lesion destroyed using liquid nitrogen: Yes   Region frozen until ice ball extended beyond lesion: Yes   Outcome: patient tolerated procedure well with no complications   Post-procedure details: wound care instructions given     Actinic Damage - diffuse scaly erythematous macules with underlying dyspigmentation - Recommend daily broad spectrum sunscreen SPF 30+ to sun-exposed areas, reapply every 2 hours as needed.  - Call for new or changing lesions.  Seborrheic Keratoses - Stuck-on, waxy, tan-brown papules and plaques  - Discussed benign etiology and prognosis. - Observe - Call  for any changes   Return in about 3 months (around 01/30/2020).  Luther Redo, CMA, am acting as scribe for Sarina Ser, MD .  Documentation: I have reviewed the above documentation for accuracy and completeness, and I agree with the above.  Sarina Ser, MD

## 2019-11-02 ENCOUNTER — Encounter: Payer: Self-pay | Admitting: Dermatology

## 2019-11-10 ENCOUNTER — Telehealth: Payer: Self-pay

## 2019-11-10 NOTE — Telephone Encounter (Signed)
Patient advised of biopsy results and scheduled for [facility].   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

## 2019-11-11 ENCOUNTER — Telehealth: Payer: Self-pay

## 2019-11-11 NOTE — Telephone Encounter (Signed)
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

## 2019-11-11 NOTE — Telephone Encounter (Signed)
Left message on voicemail to return my call.  

## 2019-11-12 ENCOUNTER — Telehealth: Payer: Self-pay

## 2019-11-12 NOTE — Telephone Encounter (Signed)
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

## 2019-11-12 NOTE — Telephone Encounter (Signed)
Left message on voicemail to return my call.  

## 2019-11-18 ENCOUNTER — Telehealth: Payer: Self-pay

## 2019-11-18 NOTE — Progress Notes (Signed)
Unable to contact patient after leaving three voice mails. Letter mailed today.

## 2019-11-18 NOTE — Telephone Encounter (Signed)
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

## 2019-11-18 NOTE — Telephone Encounter (Signed)
Left message on voicemail to return my call.  

## 2019-12-18 ENCOUNTER — Ambulatory Visit: Payer: Medicare Other | Admitting: Dermatology

## 2019-12-18 ENCOUNTER — Other Ambulatory Visit: Payer: Self-pay

## 2019-12-18 DIAGNOSIS — C4492 Squamous cell carcinoma of skin, unspecified: Secondary | ICD-10-CM

## 2019-12-18 DIAGNOSIS — D0439 Carcinoma in situ of skin of other parts of face: Secondary | ICD-10-CM

## 2019-12-18 DIAGNOSIS — L578 Other skin changes due to chronic exposure to nonionizing radiation: Secondary | ICD-10-CM | POA: Diagnosis not present

## 2019-12-18 NOTE — Progress Notes (Signed)
   Follow-Up Visit   Subjective  Brandy Oliver is a 79 y.o. female who presents for the following: SCCIS (L lat chin - patient is here today for [facility] ).  The following portions of the chart were reviewed this encounter and updated as appropriate:  Tobacco  Allergies  Meds  Problems  Med Hx  Surg Hx  Fam Hx     Review of Systems:  No other skin or systemic complaints except as noted in HPI or Assessment and Plan.  Objective  Well appearing patient in no apparent distress; mood and affect are within normal limits.  A focused examination was performed including the face. Relevant physical exam findings are noted in the Assessment and Plan.  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  Destruction of lesion Complexity: extensive   Destruction method: electrodesiccation and curettage   Informed consent: discussed and consent obtained   Timeout:  patient name, date of birth, surgical site, and procedure verified Procedure prep:  Patient was prepped and draped in usual sterile fashion Prep type:  Isopropyl alcohol Anesthesia: the lesion was anesthetized in a standard fashion   Anesthetic:  1% lidocaine w/ epinephrine 1-100,000 buffered w/ 8.4% NaHCO3 Curettage performed in three different directions: Yes   Electrodesiccation performed over the curetted area: Yes   Lesion length (cm):  0.6 Lesion width (cm):  0.6 Margin per side (cm):  0.3 Final wound size (cm):  1.2 Hemostasis achieved with:  pressure, aluminum chloride and electrodesiccation Outcome: patient tolerated procedure well with no complications   Post-procedure details: sterile dressing applied and wound care instructions given   Dressing type: bandage and petrolatum     Actinic Damage - diffuse scaly erythematous macules with underlying dyspigmentation - Recommend daily broad spectrum sunscreen SPF 30+ to sun-exposed areas, reapply every 2 hours as  needed.  - Call for new or changing lesions.   Return for appointment as scheduled.  Luther Redo, CMA, am acting as scribe for Sarina Ser, MD .  Documentation: I have reviewed the above documentation for accuracy and completeness, and I agree with the above.  Sarina Ser, MD

## 2019-12-18 NOTE — Patient Instructions (Signed)

## 2019-12-19 ENCOUNTER — Encounter: Payer: Self-pay | Admitting: Dermatology

## 2020-02-09 ENCOUNTER — Ambulatory Visit: Payer: Medicare Other | Admitting: Dermatology

## 2021-02-12 ENCOUNTER — Other Ambulatory Visit: Payer: Self-pay

## 2021-02-12 ENCOUNTER — Emergency Department: Payer: Medicare Other

## 2021-02-12 ENCOUNTER — Encounter: Payer: Self-pay | Admitting: Emergency Medicine

## 2021-02-12 DIAGNOSIS — I10 Essential (primary) hypertension: Secondary | ICD-10-CM | POA: Diagnosis not present

## 2021-02-12 DIAGNOSIS — S92002A Unspecified fracture of left calcaneus, initial encounter for closed fracture: Secondary | ICD-10-CM | POA: Insufficient documentation

## 2021-02-12 DIAGNOSIS — W109XXA Fall (on) (from) unspecified stairs and steps, initial encounter: Secondary | ICD-10-CM | POA: Insufficient documentation

## 2021-02-12 DIAGNOSIS — Z79899 Other long term (current) drug therapy: Secondary | ICD-10-CM | POA: Insufficient documentation

## 2021-02-12 DIAGNOSIS — Z85820 Personal history of malignant melanoma of skin: Secondary | ICD-10-CM | POA: Insufficient documentation

## 2021-02-12 DIAGNOSIS — E119 Type 2 diabetes mellitus without complications: Secondary | ICD-10-CM | POA: Diagnosis not present

## 2021-02-12 DIAGNOSIS — S99922A Unspecified injury of left foot, initial encounter: Secondary | ICD-10-CM | POA: Diagnosis present

## 2021-02-12 DIAGNOSIS — W19XXXA Unspecified fall, initial encounter: Secondary | ICD-10-CM

## 2021-02-12 DIAGNOSIS — I2510 Atherosclerotic heart disease of native coronary artery without angina pectoris: Secondary | ICD-10-CM | POA: Insufficient documentation

## 2021-02-12 DIAGNOSIS — Z20822 Contact with and (suspected) exposure to covid-19: Secondary | ICD-10-CM | POA: Insufficient documentation

## 2021-02-12 DIAGNOSIS — Z7984 Long term (current) use of oral hypoglycemic drugs: Secondary | ICD-10-CM | POA: Insufficient documentation

## 2021-02-12 DIAGNOSIS — Y9301 Activity, walking, marching and hiking: Secondary | ICD-10-CM | POA: Insufficient documentation

## 2021-02-12 NOTE — ED Triage Notes (Addendum)
Patient states that she tripped and fell down 4 stairs. Patient denies hitting her head. Patient with pain, swelling  and bruising to left foot.

## 2021-02-12 NOTE — ED Notes (Signed)
Patient arriving via [facility] for left foot injury. Obvious deformity to left foot. Patient reports twisting able while walking down the stairs. EMS vital stable.

## 2021-02-13 ENCOUNTER — Emergency Department: Payer: Medicare Other

## 2021-02-13 ENCOUNTER — Emergency Department
Admission: EM | Admit: 2021-02-13 | Discharge: 2021-02-13 | Disposition: A | Discharge status: Other Acute Inpatient Hospital | Payer: Medicare Other | Attending: Emergency Medicine | Admitting: Emergency Medicine

## 2021-02-13 DIAGNOSIS — W19XXXA Unspecified fall, initial encounter: Secondary | ICD-10-CM

## 2021-02-13 LAB — CBC WITH DIFFERENTIAL/PLATELET
Abs Immature Granulocytes: 0.05 10*3/uL (ref 0.00–0.07)
Basophils Absolute: 0.1 10*3/uL (ref 0.0–0.1)
Basophils Relative: 1 %
Eosinophils Absolute: 0.1 10*3/uL (ref 0.0–0.5)
Eosinophils Relative: 1 %
HCT: 36.8 % (ref 36.0–46.0)
Hemoglobin: 12.6 g/dL (ref 12.0–15.0)
Immature Granulocytes: 0 %
Lymphocytes Relative: 28 %
Lymphs Abs: 3.4 10*3/uL (ref 0.7–4.0)
MCH: 27.9 pg (ref 26.0–34.0)
MCHC: 34.2 g/dL (ref 30.0–36.0)
MCV: 81.6 fL (ref 80.0–100.0)
Monocytes Absolute: 1 10*3/uL (ref 0.1–1.0)
Monocytes Relative: 8 %
Neutro Abs: 7.4 10*3/uL (ref 1.7–7.7)
Neutrophils Relative %: 62 %
Platelets: 215 10*3/uL (ref 150–400)
RBC: 4.51 MIL/uL (ref 3.87–5.11)
RDW: 12.8 % (ref 11.5–15.5)
WBC: 12.1 10*3/uL — ABNORMAL HIGH (ref 4.0–10.5)
nRBC: 0 % (ref 0.0–0.2)

## 2021-02-13 LAB — RESP PANEL BY RT-PCR (FLU A&B, COVID) ARPGX2
Influenza A by PCR: NEGATIVE
Influenza B by PCR: NEGATIVE
SARS Coronavirus 2 by RT PCR: NEGATIVE

## 2021-02-13 MED ORDER — ONDANSETRON HCL 4 MG/2ML IJ SOLN
4.0000 mg | Freq: Once | INTRAMUSCULAR | Status: AC
  Administered 2021-02-13: 4 mg via INTRAVENOUS
  Filled 2021-02-13: qty 2

## 2021-02-13 MED ORDER — FENTANYL CITRATE PF 50 MCG/ML IJ SOSY: 50.0000 ug | PREFILLED_SYRINGE | Freq: Once | INTRAMUSCULAR | Status: DC

## 2021-02-13 MED ORDER — MORPHINE SULFATE (PF) 4 MG/ML IV SOLN
4.0000 mg | Freq: Once | INTRAVENOUS | Status: AC
  Administered 2021-02-13: 4 mg via INTRAVENOUS
  Filled 2021-02-13: qty 1

## 2021-02-13 NOTE — ED Notes (Signed)
See triage note  presents s/p fall  states she rolled her ankle last pm  positive swelling and bruising noted   good pulses

## 2021-02-13 NOTE — ED Provider Notes (Signed)
[facility]  ____________________________________________   Event Date/Time   First MD Initiated Contact with Patient 02/13/21 [phone]     (approximate)  I have reviewed the triage vital signs and the nursing notes.   HISTORY  Chief Complaint Fall    HPI Brandy Oliver is a 80 y.o. female with past medical history of hypertension, GERD, DM who presents with left foot pain.  Patient was walking down the stairs around 5 PM last night when she lost her footing, falling down about 5 stairs.  She fell onto the left foot and then proceeded to fall down each of the other stairs.  She did not hit her head.  Denies loss of consciousness.  She endorses pain of the left foot and ankle and inability to bear weight.  Denies numbness or tingling.  Has noticed significant swelling.  She denies injury or pain to the knees hips chest abdomen or pelvis.         Past Medical History:  Diagnosis Date   Cancer ([place])    skin    Cervical disc disease    Colon adenomas    Coronary artery disease    Diabetes mellitus without complication (HCC)    Diarrhea    Dysphagia    GERD (gastroesophageal reflux disease)    Hyperlipidemia    Hypertension    IDA (iron deficiency anemia)    Meningioma (HCC)    Neuropathy    NSVT (nonsustained ventricular tachycardia)    PUD (peptic ulcer disease)    Sleep apnea    Squamous cell carcinoma of skin 11/30/2016   Right lat. calf. SCCis. EDC.   Squamous cell carcinoma of skin 12/27/2017   Right dorsum wrist. WD SCC. EDC.   Squamous cell carcinoma of skin 10/30/2019   Left lat chin. SCCis    There are no problems to display for this patient.   Past Surgical History:  Procedure Laterality Date   ABDOMINAL HYSTERECTOMY     COLONOSCOPY     COLONOSCOPY WITH PROPOFOL N/A 06/10/2018   Procedure: COLONOSCOPY WITH PROPOFOL;  Surgeon: Manya Silvas, MD;  Location: [facility] ENDOSCOPY;  Service: Endoscopy;  Laterality: N/A;    ESOPHAGOGASTRODUODENOSCOPY     ESOPHAGOGASTRODUODENOSCOPY N/A 06/10/2018   Procedure: ESOPHAGOGASTRODUODENOSCOPY (EGD);  Surgeon: Manya Silvas, MD;  Location: [facility] ENDOSCOPY;  Service: Endoscopy;  Laterality: N/A;    Prior to Admission medications   Medication Sig Start Date End Date Taking? Authorizing Provider  ALPRAZolam Duanne Moron) 0.5 MG tablet Take 0.5 mg by mouth at bedtime as needed for anxiety.    [provider]  baclofen (LIORESAL) 10 MG tablet Take 10 mg by mouth 2 (two) times daily.    [provider]  calcium-vitamin D (OSCAL WITH D) 500-200 MG-UNIT tablet Take 1 tablet by mouth 2 (two) times daily.    [provider]  citalopram (CELEXA) 10 MG tablet Take 10 mg by mouth daily.    [provider]  cyclobenzaprine (FLEXERIL) 10 MG tablet Take 10 mg by mouth 3 (three) times daily as needed for muscle spasms.    [provider]  glimepiride (AMARYL) 4 MG tablet Take 4 mg by mouth daily with breakfast.    [provider]  Iron-Vitamin C (VITRON-C PO) Take by mouth 3 (three) times a week.    [provider]  losartan-hydrochlorothiazide (HYZAAR) 100-12.5 MG tablet Take 1 tablet by mouth daily.    [provider]  meclizine (ANTIVERT) 25 MG tablet Take  25 mg by mouth 3 (three) times daily as needed for dizziness.    [provider]  metFORMIN (GLUCOPHAGE) 500 MG tablet Take by mouth 2 (two) times daily with a meal.    [provider]  omeprazole (PRILOSEC) 20 MG capsule Take 20 mg by mouth 2 (two) times daily before a meal.    [provider]  pravastatin (PRAVACHOL) 40 MG tablet Take 40 mg by mouth daily.    [provider]  traZODone (DESYREL) 50 MG tablet Take 50 mg by mouth at bedtime.    [provider]  verapamil (CALAN-SR) 180 MG CR tablet Take 180 mg by mouth at bedtime.    [provider]  vitamin B-12 (CYANOCOBALAMIN) 500 MCG tablet Take 500 mcg by mouth  daily.    [provider]  zolpidem (AMBIEN) 10 MG tablet Take 10 mg by mouth at bedtime as needed for sleep.    [provider]    Allergies Penicillins  Family History  Problem Relation Age of Onset   Breast cancer Neg Hx     Social History Social History   Tobacco Use   Smoking status: Never   Smokeless tobacco: Never  Vaping Use   Vaping Use: Never used  Substance Use Topics   Alcohol use: Never   Drug use: Never    Review of Systems   Review of Systems  Musculoskeletal:  Positive for arthralgias and myalgias.  Neurological:  Negative for syncope and light-headedness.  All other systems reviewed and are negative.  Physical Exam Updated Vital Signs BP (!) 158/80 (BP Location: Right Arm)   Pulse 88   Temp 98.6 F (37 C) (Oral)   Resp 16   Ht 5\' 6"  (1.676 m)   Wt 59 kg   SpO2 98%   BMI 20.98 kg/m   Physical Exam Vitals and nursing note reviewed.  Constitutional:      General: She is not in acute distress.    Appearance: Normal appearance.  HENT:     Head: Normocephalic and atraumatic.  Eyes:     General: No scleral icterus.    Conjunctiva/sclera: Conjunctivae normal.  Pulmonary:     Effort: Pulmonary effort is normal. No respiratory distress.     Breath sounds: No stridor.  Musculoskeletal:        General: Deformity and signs of injury present.     Cervical back: Normal range of motion.     Comments: Significant swelling of the left foot and ankle, there is no skin breakdown, no skin tenting DP pulse is not palpable due to swelling but is dopplerable Mobile PT pulse Good cap refill, foot is warm Unable to range due to significant pain  No tenderness to palpation of the knee hip pelvis chest wall abdomen and C-spine  Skin:    General: Skin is dry.     Coloration: Skin is not jaundiced or pale.  Neurological:     General: No focal deficit present.     Mental Status: She is alert and oriented to person, place, and time. Mental  status is at baseline.  Psychiatric:        Mood and Affect: Mood normal.        Behavior: Behavior normal.     LABS (all labs ordered are listed, but only abnormal results are displayed)  Labs Reviewed - No data to display ____________________________________________  EKG  N/a ____________________________________________  RADIOLOGY Almeta Monas, personally viewed and evaluated these images (plain  radiographs) as part of my medical decision making, as well as reviewing the written report by the radiologist.  ED MD interpretation: I reviewed the x-ray of the foot which shows a comminuted and displaced calcaneus fracture as well as a distal fibula fracture    ____________________________________________   PROCEDURES  Procedure(s) performed (including Critical Care):  Procedures   ____________________________________________   INITIAL IMPRESSION / ASSESSMENT AND PLAN / ED COURSE     Pt is an 80 year old female presents with a calcaneus fracture after a fall downstairs yesterday.  She did not sustain any other injuries did not hit her head.  On exam there is significant swelling but she is neurovascular intact without skin tenting or skin breakdown.  She has a comminuted and displaced calcaneus fracture.  Spoke with orthopedist on-call who recommends discussing with podiatry.  Podiatrist Dr. Luana Shu does feel that this will likely need surgery but given the swelling would need to be delayed.  She would need to be strictly nonweightbearing.  Does recommend getting a CT to further evaluate.  We will get a CT of the ankle and foot.  Pain control with opioids.   CT redemonstrates a highly comminuted calcaneal fracture with significant central collapse.  Discussed with Dr. Luana Shu who discussed with his colleague who feels that the patient would best be served with an urgent Ex-Fix which unfortunately they are not able to do today.  Discussed with transfer center at [facility] and  the patient was accepted for ED to ED transfer for urgent orthopedic evaluation.     ____________________________________________   FINAL CLINICAL IMPRESSION(S) / ED DIAGNOSES  Final diagnoses:  Fall     ED Discharge Orders     None        Note:  This document was prepared using Dragon voice recognition software and may include unintentional dictation errors.    Rada Hay, MD 02/13/21 1315

## 2021-08-23 ENCOUNTER — Other Ambulatory Visit (HOSPITAL_COMMUNITY): Payer: Self-pay | Admitting: Orthopedic Surgery

## 2021-08-23 ENCOUNTER — Other Ambulatory Visit: Payer: Self-pay | Admitting: Orthopedic Surgery

## 2021-08-23 DIAGNOSIS — G8929 Other chronic pain: Secondary | ICD-10-CM

## 2021-08-23 DIAGNOSIS — M4807 Spinal stenosis, lumbosacral region: Secondary | ICD-10-CM

## 2021-09-06 ENCOUNTER — Encounter: Payer: Self-pay | Admitting: Radiology

## 2021-09-06 ENCOUNTER — Ambulatory Visit
Admission: RE | Admit: 2021-09-06 | Discharge: 2021-09-06 | Disposition: A | Discharge status: Home / Self Care | Payer: Medicare Other | Source: Ambulatory Visit | Attending: Orthopedic Surgery | Admitting: Orthopedic Surgery

## 2021-09-06 DIAGNOSIS — M4807 Spinal stenosis, lumbosacral region: Secondary | ICD-10-CM | POA: Insufficient documentation

## 2021-09-06 DIAGNOSIS — M5442 Lumbago with sciatica, left side: Secondary | ICD-10-CM | POA: Insufficient documentation

## 2021-09-06 DIAGNOSIS — G8929 Other chronic pain: Secondary | ICD-10-CM | POA: Insufficient documentation

## 2021-09-10 DIAGNOSIS — M4807 Spinal stenosis, lumbosacral region: Secondary | ICD-10-CM | POA: Diagnosis present

## 2021-10-07 ENCOUNTER — Other Ambulatory Visit: Payer: Self-pay | Admitting: Family Medicine

## 2021-10-07 DIAGNOSIS — G8929 Other chronic pain: Secondary | ICD-10-CM

## 2021-10-20 ENCOUNTER — Ambulatory Visit
Admission: RE | Admit: 2021-10-20 | Discharge: 2021-10-20 | Disposition: A | Discharge status: Home / Self Care | Payer: Medicare Other | Source: Ambulatory Visit | Attending: Family Medicine | Admitting: Family Medicine

## 2021-10-20 DIAGNOSIS — G8929 Other chronic pain: Secondary | ICD-10-CM

## 2021-10-20 MED ORDER — GADOBENATE DIMEGLUMINE 529 MG/ML IV SOLN
12.0000 mL | Freq: Once | INTRAVENOUS | Status: AC | PRN
  Administered 2021-10-20: 12 mL via INTRAVENOUS

## 2022-05-23 ENCOUNTER — Ambulatory Visit: Payer: Medicare Other | Admitting: Dermatology

## 2022-06-06 ENCOUNTER — Encounter: Payer: Self-pay | Admitting: Dermatology

## 2022-06-06 ENCOUNTER — Ambulatory Visit: Payer: Medicare Other | Admitting: Dermatology

## 2022-06-06 VITALS — BP 110/63 | HR 89

## 2022-06-06 DIAGNOSIS — L820 Inflamed seborrheic keratosis: Secondary | ICD-10-CM | POA: Diagnosis not present

## 2022-06-06 DIAGNOSIS — L814 Other melanin hyperpigmentation: Secondary | ICD-10-CM

## 2022-06-06 DIAGNOSIS — L578 Other skin changes due to chronic exposure to nonionizing radiation: Secondary | ICD-10-CM | POA: Diagnosis not present

## 2022-06-06 DIAGNOSIS — Z8589 Personal history of malignant neoplasm of other organs and systems: Secondary | ICD-10-CM

## 2022-06-06 DIAGNOSIS — Z85828 Personal history of other malignant neoplasm of skin: Secondary | ICD-10-CM

## 2022-06-06 DIAGNOSIS — Z86007 Personal history of in-situ neoplasm of skin: Secondary | ICD-10-CM | POA: Diagnosis not present

## 2022-06-06 DIAGNOSIS — D229 Melanocytic nevi, unspecified: Secondary | ICD-10-CM

## 2022-06-06 DIAGNOSIS — L570 Actinic keratosis: Secondary | ICD-10-CM

## 2022-06-06 DIAGNOSIS — Z1283 Encounter for screening for malignant neoplasm of skin: Secondary | ICD-10-CM | POA: Diagnosis not present

## 2022-06-06 DIAGNOSIS — L821 Other seborrheic keratosis: Secondary | ICD-10-CM

## 2022-06-06 NOTE — Patient Instructions (Addendum)
 Cryotherapy Aftercare  Wash gently with soap and water everyday.   Apply Vaseline and Band-Aid daily until healed. Due to recent changes in healthcare laws, you may see results of your pathology and/or laboratory studies on MyChart before the doctors have had a chance to review them. We understand that in some cases there may be results that are confusing or concerning to you. Please understand that not all results are received at the same time and often the doctors may need to interpret multiple results in order to provide you with the best plan of care or course of treatment. Therefore, we ask that you please give us 2 business days to thoroughly review all your results before contacting the office for clarification. Should we see a critical lab result, you will be contacted sooner.   If You Need Anything After Your Visit  If you have any questions or concerns for your doctor, please call our main line at [phone] and press option 4 to reach your doctor's medical assistant. If no one answers, please leave a voicemail as directed and we will return your call as soon as possible. Messages left after 4 pm will be answered the following business day.   You may also send us a message via MyChart. We typically respond to MyChart messages within 1-2 business days.  For prescription refills, please ask your pharmacy to contact our office. Our fax number is [phone].  If you have an urgent issue when the clinic is closed that cannot wait until the next business day, you can page your doctor at the number below.    Please note that while we do our best to be available for urgent issues outside of office hours, we are not available 24/7.   If you have an urgent issue and are unable to reach us, you may choose to seek medical care at your doctor's office, retail clinic, urgent care center, or emergency room.  If you have a medical emergency, please immediately call 911 or go to the emergency  department.  Pager Numbers  - Dr. Kowalski: [phone]  - Dr. Moye: [phone]  - Dr. Stewart: [phone]  In the event of inclement weather, please call our main line at [phone] for an update on the status of any delays or closures.  Dermatology Medication Tips: Please keep the boxes that topical medications come in in order to help keep track of the instructions about where and how to use these. Pharmacies typically print the medication instructions only on the boxes and not directly on the medication tubes.   If your medication is too expensive, please contact our office at [phone] option 4 or send us a message through MyChart.   We are unable to tell what your co-pay for medications will be in advance as this is different depending on your insurance coverage. However, we may be able to find a substitute medication at lower cost or fill out paperwork to get insurance to cover a needed medication.   If a prior authorization is required to get your medication covered by your insurance company, please allow us 1-2 business days to complete this process.  Drug prices often vary depending on where the prescription is filled and some pharmacies may offer cheaper prices.  The website www.goodrx.com contains coupons for medications through different pharmacies. The prices here do not account for what the cost may be with help from insurance (it may be cheaper with your insurance), but the website can give you the   price if you did not use any insurance.  - You can print the associated coupon and take it with your prescription to the pharmacy.  - You may also stop by our office during regular business hours and pick up a GoodRx coupon card.  - If you need your prescription sent electronically to a different pharmacy, notify our office through [place] MyChart or by phone at [phone] option 4.     Si Usted Necesita Algo Despus de Su Visita  Tambin puede enviarnos un  mensaje a travs de MyChart. Por lo general respondemos a los mensajes de MyChart en el transcurso de 1 a 2 das hbiles.  Para renovar recetas, por favor pida a su farmacia que se ponga en contacto con nuestra oficina. Nuestro nmero de fax es el [phone].  Si tiene un asunto urgente cuando la clnica est cerrada y que no puede esperar hasta el siguiente da hbil, puede llamar/localizar a su doctor(a) al nmero que aparece a continuacin.   Por favor, tenga en cuenta que aunque hacemos todo lo posible para estar disponibles para asuntos urgentes fuera del horario de oficina, no estamos disponibles las 24 horas del da, los 7 das de la semana.   Si tiene un problema urgente y no puede comunicarse con nosotros, puede optar por buscar atencin mdica  en el consultorio de su doctor(a), en una clnica privada, en un centro de atencin urgente o en una sala de emergencias.  Si tiene una emergencia mdica, por favor llame inmediatamente al 911 o vaya a la sala de emergencias.  Nmeros de bper  - Dr. Kowalski: [phone]  - Dra. Moye: [phone]  - Dra. Stewart: [phone]  En caso de inclemencias del tiempo, por favor llame a nuestra lnea principal al [phone] para una actualizacin sobre el estado de cualquier retraso o cierre.  Consejos para la medicacin en dermatologa: Por favor, guarde las cajas en las que vienen los medicamentos de uso tpico para ayudarle a seguir las instrucciones sobre dnde y cmo usarlos. Las farmacias generalmente imprimen las instrucciones del medicamento slo en las cajas y no directamente en los tubos del medicamento.   Si su medicamento es muy caro, por favor, pngase en contacto con nuestra oficina llamando al [phone] y presione la opcin 4 o envenos un mensaje a travs de MyChart.   No podemos decirle cul ser su copago por los medicamentos por adelantado ya que esto es diferente dependiendo de la cobertura de su seguro. Sin embargo,  es posible que podamos encontrar un medicamento sustituto a menor costo o llenar un formulario para que el seguro cubra el medicamento que se considera necesario.   Si se requiere una autorizacin previa para que su compaa de seguros cubra su medicamento, por favor permtanos de 1 a 2 das hbiles para completar este proceso.  Los precios de los medicamentos varan con frecuencia dependiendo del lugar de dnde se surte la receta y alguna farmacias pueden ofrecer precios ms baratos.  El sitio web www.goodrx.com tiene cupones para medicamentos de diferentes farmacias. Los precios aqu no tienen en cuenta lo que podra costar con la ayuda del seguro (puede ser ms barato con su seguro), pero el sitio web puede darle el precio si no utiliz ningn seguro.  - Puede imprimir el cupn correspondiente y llevarlo con su receta a la farmacia.  - Tambin puede pasar por nuestra oficina durante el horario de atencin regular y recoger una tarjeta de cupones de GoodRx.  - Si necesita que   su receta se enve electrnicamente a una farmacia diferente, informe a nuestra oficina a travs de MyChart de [place] o por telfono llamando al [phone] y presione la opcin 4.  

## 2022-06-06 NOTE — Progress Notes (Signed)
Follow-Up Visit   Subjective  Brandy Oliver is a 82 y.o. female who presents for the following: Total body skin exam (Hx of SCC, SCC IS) and growth (R earlobe, ~23yr tender/Trunk, arms, irritating spots). The patient presents for Total-Body Skin Exam (TBSE) for skin cancer screening and mole check.  The patient has spots, moles and lesions to be evaluated, some may be new or changing and the patient has concerns that these could be cancer.   The following portions of the chart were reviewed this encounter and updated as appropriate:   Tobacco  Allergies  Meds  Problems  Med Hx  Surg Hx  Fam Hx     Review of Systems:  No other skin or systemic complaints except as noted in HPI or Assessment and Plan.  Objective  Well appearing patient in no apparent distress; mood and affect are within normal limits.  A full examination was performed including scalp, head, eyes, ears, nose, lips, neck, chest, axillae, abdomen, back, buttocks, bilateral upper extremities, bilateral lower extremities, hands, feet, fingers, toes, fingernails, and toenails. All findings within normal limits unless otherwise noted below.  R forearm x 3, R earlobe x 1, L post ankle x 1, back/neck x 12 (17) Stuck on waxy paps with erythema  face x 8 (8) Pink scaly macules   Assessment & Plan   Lentigines - Scattered tan macules - Due to sun exposure - Benign-appearing, observe - Recommend daily broad spectrum sunscreen SPF 30+ to sun-exposed areas, reapply every 2 hours as needed. - Call for any changes  Seborrheic Keratoses - Stuck-on, waxy, tan-brown papules and/or plaques  - Benign-appearing - Discussed benign etiology and prognosis. - Observe - Call for any changes  Melanocytic Nevi - Tan-brown and/or pink-flesh-colored symmetric macules and papules - Benign appearing on exam today - Observation - Call clinic for new or changing moles - Recommend daily use of broad spectrum spf 30+ sunscreen to  sun-exposed areas.   Hemangiomas - Red papules - Discussed benign nature - Observe - Call for any changes  Actinic Damage - Chronic condition, secondary to cumulative UV/sun exposure - diffuse scaly erythematous macules with underlying dyspigmentation - Recommend daily broad spectrum sunscreen SPF 30+ to sun-exposed areas, reapply every 2 hours as needed.  - Staying in the shade or wearing long sleeves, sun glasses (UVA+UVB protection) and wide brim hats (4-inch brim around the entire circumference of the hat) are also recommended for sun protection.  - Call for new or changing lesions.  Skin cancer screening performed today.   History of Squamous Cell Carcinoma of the Skin - No evidence of recurrence today - No lymphadenopathy - Recommend regular full body skin exams - Recommend daily broad spectrum sunscreen SPF 30+ to sun-exposed areas, reapply every 2 hours as needed.  - Call if any new or changing lesions are noted between office visits - R dorsum wrist  History of Squamous Cell Carcinoma in Situ of the Skin - No evidence of recurrence today - Recommend regular full body skin exams - Recommend daily broad spectrum sunscreen SPF 30+ to sun-exposed areas, reapply every 2 hours as needed.  - Call if any new or changing lesions are noted between office visits  - R lat calf, L lat chin   Inflamed seborrheic keratosis (17) R forearm x 3, R earlobe x 1, L post ankle x 1, back/neck x 12 Symptomatic, irritating, patient would like treated. Destruction of lesion - R forearm x 3, R earlobe x 1,  L post ankle x 1, back/neck x 12 Complexity: simple   Destruction method: cryotherapy   Informed consent: discussed and consent obtained   Timeout:  patient name, date of birth, surgical site, and procedure verified Lesion destroyed using liquid nitrogen: Yes   Region frozen until ice ball extended beyond lesion: Yes   Outcome: patient tolerated procedure well with no complications    Post-procedure details: wound care instructions given    AK (actinic keratosis) (8) face x 8 Destruction of lesion - face x 8 Complexity: simple   Destruction method: cryotherapy   Informed consent: discussed and consent obtained   Timeout:  patient name, date of birth, surgical site, and procedure verified Lesion destroyed using liquid nitrogen: Yes   Region frozen until ice ball extended beyond lesion: Yes   Outcome: patient tolerated procedure well with no complications   Post-procedure details: wound care instructions given    Return in about 1 year (around 06/07/2023) for TBSE, Hx of SCC, AK f/u.  I, Othelia Pulling, RMA, am acting as scribe for Sarina Ser, MD . Documentation: I have reviewed the above documentation for accuracy and completeness, and I agree with the above.  Sarina Ser, MD

## 2022-06-08 ENCOUNTER — Emergency Department: Payer: Medicare Other

## 2022-06-08 ENCOUNTER — Other Ambulatory Visit: Payer: Self-pay

## 2022-06-08 ENCOUNTER — Inpatient Hospital Stay
Admission: EM | Admit: 2022-06-08 | Discharge: 2022-06-10 | DRG: 071 | Disposition: A | Discharge status: Home / Self Care | Payer: Medicare Other | Source: Ambulatory Visit | Attending: Obstetrics and Gynecology | Admitting: Obstetrics and Gynecology

## 2022-06-08 DIAGNOSIS — Z7983 Long term (current) use of bisphosphonates: Secondary | ICD-10-CM

## 2022-06-08 DIAGNOSIS — R197 Diarrhea, unspecified: Secondary | ICD-10-CM | POA: Diagnosis present

## 2022-06-08 DIAGNOSIS — R29818 Other symptoms and signs involving the nervous system: Secondary | ICD-10-CM | POA: Diagnosis not present

## 2022-06-08 DIAGNOSIS — I10 Essential (primary) hypertension: Secondary | ICD-10-CM | POA: Diagnosis present

## 2022-06-08 DIAGNOSIS — Z7985 Long-term (current) use of injectable non-insulin antidiabetic drugs: Secondary | ICD-10-CM

## 2022-06-08 DIAGNOSIS — I2510 Atherosclerotic heart disease of native coronary artery without angina pectoris: Secondary | ICD-10-CM | POA: Diagnosis present

## 2022-06-08 DIAGNOSIS — E785 Hyperlipidemia, unspecified: Secondary | ICD-10-CM | POA: Diagnosis present

## 2022-06-08 DIAGNOSIS — Z888 Allergy status to other drugs, medicaments and biological substances status: Secondary | ICD-10-CM

## 2022-06-08 DIAGNOSIS — E1140 Type 2 diabetes mellitus with diabetic neuropathy, unspecified: Secondary | ICD-10-CM | POA: Diagnosis present

## 2022-06-08 DIAGNOSIS — K219 Gastro-esophageal reflux disease without esophagitis: Secondary | ICD-10-CM | POA: Diagnosis present

## 2022-06-08 DIAGNOSIS — G9341 Metabolic encephalopathy: Secondary | ICD-10-CM | POA: Diagnosis not present

## 2022-06-08 DIAGNOSIS — R000 Tachycardia, unspecified: Secondary | ICD-10-CM | POA: Diagnosis present

## 2022-06-08 DIAGNOSIS — Z79899 Other long term (current) drug therapy: Secondary | ICD-10-CM

## 2022-06-08 DIAGNOSIS — D509 Iron deficiency anemia, unspecified: Secondary | ICD-10-CM | POA: Diagnosis present

## 2022-06-08 DIAGNOSIS — Z85828 Personal history of other malignant neoplasm of skin: Secondary | ICD-10-CM

## 2022-06-08 DIAGNOSIS — F329 Major depressive disorder, single episode, unspecified: Secondary | ICD-10-CM | POA: Diagnosis present

## 2022-06-08 DIAGNOSIS — G9340 Encephalopathy, unspecified: Secondary | ICD-10-CM

## 2022-06-08 DIAGNOSIS — G8929 Other chronic pain: Secondary | ICD-10-CM | POA: Diagnosis present

## 2022-06-08 DIAGNOSIS — T43215A Adverse effect of selective serotonin and norepinephrine reuptake inhibitors, initial encounter: Secondary | ICD-10-CM | POA: Diagnosis present

## 2022-06-08 DIAGNOSIS — Z8711 Personal history of peptic ulcer disease: Secondary | ICD-10-CM

## 2022-06-08 DIAGNOSIS — E1165 Type 2 diabetes mellitus with hyperglycemia: Secondary | ICD-10-CM | POA: Diagnosis present

## 2022-06-08 DIAGNOSIS — Z7984 Long term (current) use of oral hypoglycemic drugs: Secondary | ICD-10-CM

## 2022-06-08 DIAGNOSIS — E119 Type 2 diabetes mellitus without complications: Secondary | ICD-10-CM | POA: Insufficient documentation

## 2022-06-08 DIAGNOSIS — M47816 Spondylosis without myelopathy or radiculopathy, lumbar region: Secondary | ICD-10-CM | POA: Diagnosis present

## 2022-06-08 DIAGNOSIS — E861 Hypovolemia: Secondary | ICD-10-CM | POA: Diagnosis present

## 2022-06-08 DIAGNOSIS — M4807 Spinal stenosis, lumbosacral region: Secondary | ICD-10-CM | POA: Diagnosis present

## 2022-06-08 DIAGNOSIS — E860 Dehydration: Secondary | ICD-10-CM

## 2022-06-08 DIAGNOSIS — R29702 NIHSS score 2: Secondary | ICD-10-CM | POA: Diagnosis present

## 2022-06-08 DIAGNOSIS — G4730 Sleep apnea, unspecified: Secondary | ICD-10-CM | POA: Diagnosis present

## 2022-06-08 DIAGNOSIS — E876 Hypokalemia: Secondary | ICD-10-CM

## 2022-06-08 DIAGNOSIS — T50995A Adverse effect of other drugs, medicaments and biological substances, initial encounter: Secondary | ICD-10-CM | POA: Diagnosis present

## 2022-06-08 DIAGNOSIS — Z86011 Personal history of benign neoplasm of the brain: Secondary | ICD-10-CM

## 2022-06-08 DIAGNOSIS — Z66 Do not resuscitate: Secondary | ICD-10-CM | POA: Diagnosis present

## 2022-06-08 DIAGNOSIS — Z1152 Encounter for screening for COVID-19: Secondary | ICD-10-CM

## 2022-06-08 DIAGNOSIS — R29810 Facial weakness: Secondary | ICD-10-CM | POA: Diagnosis present

## 2022-06-08 DIAGNOSIS — E871 Hypo-osmolality and hyponatremia: Principal | ICD-10-CM

## 2022-06-08 DIAGNOSIS — I447 Left bundle-branch block, unspecified: Secondary | ICD-10-CM | POA: Diagnosis present

## 2022-06-08 DIAGNOSIS — K279 Peptic ulcer, site unspecified, unspecified as acute or chronic, without hemorrhage or perforation: Secondary | ICD-10-CM | POA: Insufficient documentation

## 2022-06-08 DIAGNOSIS — F05 Delirium due to known physiological condition: Secondary | ICD-10-CM | POA: Diagnosis present

## 2022-06-08 DIAGNOSIS — Z880 Allergy status to penicillin: Secondary | ICD-10-CM

## 2022-06-08 LAB — CBC
HCT: 40.7 % (ref 36.0–46.0)
Hemoglobin: 14.8 g/dL (ref 12.0–15.0)
MCH: 28.4 pg (ref 26.0–34.0)
MCHC: 36.4 g/dL — ABNORMAL HIGH (ref 30.0–36.0)
MCV: 78 fL — ABNORMAL LOW (ref 80.0–100.0)
Platelets: 222 10*3/uL (ref 150–400)
RBC: 5.22 MIL/uL — ABNORMAL HIGH (ref 3.87–5.11)
RDW: 12.1 % (ref 11.5–15.5)
WBC: 9.6 10*3/uL (ref 4.0–10.5)
nRBC: 0 % (ref 0.0–0.2)

## 2022-06-08 LAB — MAGNESIUM: Magnesium: 1.6 mg/dL — ABNORMAL LOW (ref 1.7–2.4)

## 2022-06-08 LAB — URINALYSIS, ROUTINE W REFLEX MICROSCOPIC
Bacteria, UA: NONE SEEN
Bilirubin Urine: NEGATIVE
Glucose, UA: >=500 mg/dL — AB
Hgb urine dipstick: NEGATIVE
Ketones, ur: 5 mg/dL — AB
Leukocytes,Ua: NEGATIVE
Nitrite: NEGATIVE
Protein, ur: NEGATIVE mg/dL
Specific Gravity, Urine: 1.018 (ref 1.005–1.030)
Squamous Epithelial / HPF: NONE SEEN /HPF (ref 0–5)
pH: 7 (ref 5.0–8.0)

## 2022-06-08 LAB — COMPREHENSIVE METABOLIC PANEL
ALT: 17 U/L (ref 0–44)
AST: 21 U/L (ref 15–41)
Albumin: 3.9 g/dL (ref 3.5–5.0)
Alkaline Phosphatase: 80 U/L (ref 38–126)
Anion gap: 19 — ABNORMAL HIGH (ref 5–15)
BUN: 14 mg/dL (ref 8–23)
CO2: 20 mmol/L — ABNORMAL LOW (ref 22–32)
Calcium: 9.7 mg/dL (ref 8.9–10.3)
Chloride: 89 mmol/L — ABNORMAL LOW (ref 98–111)
Creatinine, Ser: 0.84 mg/dL (ref 0.44–1.00)
GFR, Estimated: >60 mL/min (ref >60)
Glucose, Bld: 294 mg/dL — ABNORMAL HIGH (ref 70–99)
Potassium: 2.7 mmol/L — CL (ref 3.5–5.1)
Sodium: 128 mmol/L — ABNORMAL LOW (ref 135–145)
Total Bilirubin: 1 mg/dL (ref 0.3–1.2)
Total Protein: 6.7 g/dL (ref 6.5–8.1)

## 2022-06-08 LAB — DIFFERENTIAL
Abs Immature Granulocytes: 0.03 10*3/uL (ref 0.00–0.07)
Basophils Absolute: 0.1 10*3/uL (ref 0.0–0.1)
Basophils Relative: 1 %
Eosinophils Absolute: 0 10*3/uL (ref 0.0–0.5)
Eosinophils Relative: 0 %
Immature Granulocytes: 0 %
Lymphocytes Relative: 23 %
Lymphs Abs: 2.2 10*3/uL (ref 0.7–4.0)
Monocytes Absolute: 0.9 10*3/uL (ref 0.1–1.0)
Monocytes Relative: 9 %
Neutro Abs: 6.4 10*3/uL (ref 1.7–7.7)
Neutrophils Relative %: 67 %

## 2022-06-08 LAB — LACTIC ACID, PLASMA: Lactic Acid, Venous: 1.2 mmol/L (ref 0.5–1.9)

## 2022-06-08 LAB — CBG MONITORING, ED: Glucose-Capillary: 299 mg/dL — ABNORMAL HIGH (ref 70–99)

## 2022-06-08 LAB — TROPONIN I (HIGH SENSITIVITY): Troponin I (High Sensitivity): 11 ng/L (ref <18)

## 2022-06-08 LAB — ETHANOL: Alcohol, Ethyl (B): <10 mg/dL (ref <10)

## 2022-06-08 LAB — PROTIME-INR
INR: 1 (ref 0.8–1.2)
Prothrombin Time: 12.8 seconds (ref 11.4–15.2)

## 2022-06-08 LAB — APTT: aPTT: 24 seconds (ref 24–36)

## 2022-06-08 MED ORDER — MAGNESIUM SULFATE 2 GM/50ML IV SOLN
2.0000 g | Freq: Once | INTRAVENOUS | Status: AC
  Administered 2022-06-08: 2 g via INTRAVENOUS
  Filled 2022-06-08: qty 50

## 2022-06-08 MED ORDER — SODIUM CHLORIDE 0.9% FLUSH: 3.0000 mL | Freq: Once | INTRAVENOUS | Status: DC

## 2022-06-08 MED ORDER — ACETAMINOPHEN 650 MG RE SUPP: 650.0000 mg | Freq: Four times a day (QID) | RECTAL | Status: DC | PRN

## 2022-06-08 MED ORDER — CITALOPRAM HYDROBROMIDE 20 MG PO TABS
10.0000 mg | ORAL_TABLET | Freq: Every day | ORAL | Status: DC
  Administered 2022-06-09 – 2022-06-10 (×2): 10 mg via ORAL
  Filled 2022-06-08 (×2): qty 1

## 2022-06-08 MED ORDER — ONDANSETRON HCL 4 MG/2ML IJ SOLN
INTRAMUSCULAR | Status: AC
  Administered 2022-06-08: 4 mg via INTRAVENOUS
  Filled 2022-06-08: qty 2

## 2022-06-08 MED ORDER — HYDRALAZINE HCL 20 MG/ML IJ SOLN: 5.0000 mg | INTRAMUSCULAR | Status: DC | PRN

## 2022-06-08 MED ORDER — ACETAMINOPHEN 325 MG PO TABS
650.0000 mg | ORAL_TABLET | Freq: Four times a day (QID) | ORAL | Status: DC | PRN
  Administered 2022-06-09: 650 mg via ORAL
  Filled 2022-06-08: qty 2

## 2022-06-08 MED ORDER — INSULIN ASPART 100 UNIT/ML IJ SOLN
0.0000 [IU] | Freq: Three times a day (TID) | INTRAMUSCULAR | Status: DC
  Administered 2022-06-09: 3 [IU] via SUBCUTANEOUS
  Administered 2022-06-09: 7 [IU] via SUBCUTANEOUS
  Administered 2022-06-09: 2 [IU] via SUBCUTANEOUS
  Administered 2022-06-10: 3 [IU] via SUBCUTANEOUS
  Filled 2022-06-08 (×4): qty 1

## 2022-06-08 MED ORDER — ENOXAPARIN SODIUM 40 MG/0.4ML IJ SOSY
40.0000 mg | PREFILLED_SYRINGE | INTRAMUSCULAR | Status: DC
  Administered 2022-06-08 – 2022-06-09 (×2): 40 mg via SUBCUTANEOUS
  Filled 2022-06-08 (×2): qty 0.4

## 2022-06-08 MED ORDER — POTASSIUM CHLORIDE 10 MEQ/100ML IV SOLN
10.0000 meq | INTRAVENOUS | Status: AC
  Administered 2022-06-08 (×3): 10 meq via INTRAVENOUS
  Filled 2022-06-08 (×2): qty 100

## 2022-06-08 MED ORDER — ONDANSETRON HCL 4 MG/2ML IJ SOLN: 4.0000 mg | Freq: Four times a day (QID) | INTRAMUSCULAR | Status: DC | PRN

## 2022-06-08 MED ORDER — ONDANSETRON HCL 4 MG/2ML IJ SOLN
4.0000 mg | Freq: Once | INTRAMUSCULAR | Status: AC
  Administered 2022-06-08: 4 mg via INTRAVENOUS

## 2022-06-08 MED ORDER — ONDANSETRON HCL 4 MG PO TABS: 4.0000 mg | ORAL_TABLET | Freq: Four times a day (QID) | ORAL | Status: DC | PRN

## 2022-06-08 MED ORDER — IOHEXOL 350 MG/ML SOLN
100.0000 mL | Freq: Once | INTRAVENOUS | Status: AC | PRN
  Administered 2022-06-08: 100 mL via INTRAVENOUS

## 2022-06-08 MED ORDER — POTASSIUM CHLORIDE CRYS ER 20 MEQ PO TBCR
40.0000 meq | EXTENDED_RELEASE_TABLET | Freq: Once | ORAL | Status: AC
  Administered 2022-06-08: 40 meq via ORAL
  Filled 2022-06-08: qty 2

## 2022-06-08 MED ORDER — GABAPENTIN 300 MG PO CAPS
600.0000 mg | ORAL_CAPSULE | Freq: Every day | ORAL | Status: DC
  Administered 2022-06-08 – 2022-06-09 (×2): 600 mg via ORAL
  Filled 2022-06-08 (×2): qty 2

## 2022-06-08 MED ORDER — SODIUM CHLORIDE 0.9 % IV SOLN: INTRAVENOUS | Status: AC

## 2022-06-08 MED ORDER — INSULIN ASPART 100 UNIT/ML IJ SOLN
0.0000 [IU] | Freq: Every day | INTRAMUSCULAR | Status: DC
  Administered 2022-06-09: 2 [IU] via SUBCUTANEOUS
  Filled 2022-06-08: qty 1

## 2022-06-08 MED ORDER — SODIUM CHLORIDE 0.9 % IV BOLUS
1000.0000 mL | Freq: Once | INTRAVENOUS | Status: AC
  Administered 2022-06-08: 1000 mL via INTRAVENOUS

## 2022-06-08 NOTE — Assessment & Plan Note (Signed)
Currently takes tramadol Will hold Cymbalta for now but should be okay to resume.  Patient was previously on Celexa

## 2022-06-08 NOTE — Assessment & Plan Note (Signed)
Hypovolemic related to diarrhea from suspected medication side effect IV hydration with NS and monitor

## 2022-06-08 NOTE — Consult Note (Signed)
[facility] Telemedicine Consult  Requesting Provider: Gatha Mayer Consult Participants: Bedside nurse, grandson Location of the provider: [place], [place] Location of the patient: [facility]  This consult was provided via telemedicine with 2-way video and audio communication. The patient/family was informed that care would be provided in this way and agreed to receive care in this manner.    Chief Complaint: Altered Mental Status  HPI: 82 yo F with AMS that has been progressive over the past week. She has had increased urinary and bowel frequency, and has been complaining of generalized weakness and confusion. She was coming to her physicians office to be evaluated for this when she had an abrupt worsening of her mental status and also seemed short of breath and therefore presented to the emergency dept. In the ED, given her abrupt worsening, a code stroke was activated.     LKW: 1 week ago tpa given?: No, out of window IR Thrombectomy? No, no LVO Modified Rankin Scale: 2-Slight disability-UNABLE to perform all activities but does not need assistance Time of teleneurologist evaluation: 17:44  Exam: There were no vitals filed for this visit.  General: in bed, NAD  1A: Level of Consciousness - 0 1B: Ask Month and Age - 0 1C: 'Blink Eyes' & 'Squeeze Hands' - 0 2: Test Horizontal Extraocular Movements - 0 3: Test Visual Fields - 0 4: Test Facial Palsy - 1(Has old right facial weakness present in epic photo) 5A: Test Left Arm Motor Drift - 0 5B: Test Right Arm Motor Drift - 0 6A: Test Left Leg Motor Drift - 0 6B: Test Right Leg Motor Drift - 0 7: Test Limb Ataxia - 0 I do question some mild left sided difficulty, but no clear passpointing 8: Test Sensation - 1(left arm and leg) 9: Test Language/Aphasia- 0 10: Test Dysarthria - 0 11: Test Extinction/Inattention - 0(cannot feel the left leg at baseline) NIHSS score: 2   Imaging Reviewed: CT/CTA/CTP - no ICH or LVO  Labs  reviewed in epic and pertinent values follow: Cbg 299   Assessment: 82 yo F with progressive confusion x 1 week. I suspect that her abrupt worsening was likely more related to envioronmental change in the setting of a mild delirium rather than a true neurological event. That being said, though she is not a candidate for lytic therapy or thrombectomy, I do think that an MRI to rule out stroke would be prudent.   Recommendations:  1) MRI brain, stroke workup only if positive 2) CMP, UA, CBC, ammonia, TSH, B12 3) will follow results   Roland Rack, MD [facility] [phone]  If 7pm- 7am, please page neurology on call as listed in [place].

## 2022-06-08 NOTE — H&P (Signed)
History and Physical    Patient: Brandy Oliver [id number] DOB: 03-24-1941 DOA: 06/08/2022 DOS: the patient was seen and examined on 06/08/2022 PCP: Rusty Aus, MD  Patient coming from: Home  Chief Complaint:  Chief Complaint  Patient presents with   Code Stroke    HPI: Brandy Oliver is a 82 y.o. female with medical history significant for Diabetes, hypertension, CAD, history of PUD, spinal stenosis, who presented to the ED as a code stroke from her PCPs office where she developed acute onset of confusion and a left facial droop.  Patient initially went to see her PCP with confusion, weakness as well as nausea with dry heaves and increased bowel movements that started over the past 3 to 4 days.  Patient had been started on Ozempic for diabetes control to her Actos and Cymbalta to replace her Celexa at her doctor's visit on 2/2 and symptoms started about a week later.  Most of the history is taken from the grandson at the bedside.  On the day patient went to see her doctor she had 6 bowel movements and while at the doctor's office she suddenly became weak and confused, arriving by EMS as a code stroke. Code stroke workup was essentially negative with negative CT head.  Brain MRI was nonacute.  CTA head and neck "most notably, there is a severe stenosis within an M3 right MCA vessel". Patient was seen in consultation by Teleneurology, Dr. Leonel Ramsay who did not recommend tPA due to being out of window and having NIH of 2.  He recommended stroke workup only if MRI was positive.  Recommended workup for delirium with CMP, UA, CBC, ammonia, TSH and B12. Additional ED workup and data review: BP at admission 180/87 with otherwise normal vitals.  Notable labs include sodium 128, potassium 2.7 and magnesium 1.6, glucose 294 EKG, personally viewed and interpreted showed sinus tachycardia at 103 with multiple PVCs and incomplete LBBB CT abdomen and pelvis without contrast showed no acute  findings. Patient received oral and IV potassium repletion, IV magnesium as well as an NS fluid bolus in the ED along with ondansetron and hospitalist consulted for admission.     Past Medical History:  Diagnosis Date   Actinic keratosis    Cancer ([place])    skin    Cervical disc disease    Colon adenomas    Coronary artery disease    Diabetes mellitus without complication (HCC)    Diarrhea    Dysphagia    GERD (gastroesophageal reflux disease)    Hyperlipidemia    Hypertension    IDA (iron deficiency anemia)    Meningioma (HCC)    Neuropathy    NSVT (nonsustained ventricular tachycardia) (HCC)    PUD (peptic ulcer disease)    Sleep apnea    Squamous cell carcinoma of skin 11/30/2016   Right lat. calf. SCCis. EDC.   Squamous cell carcinoma of skin 12/27/2017   Right dorsum wrist. WD SCC. EDC.   Squamous cell carcinoma of skin 10/30/2019   Left lat chin. SCCis   Past Surgical History:  Procedure Laterality Date   ABDOMINAL HYSTERECTOMY     COLONOSCOPY     COLONOSCOPY WITH PROPOFOL N/A 06/10/2018   Procedure: COLONOSCOPY WITH PROPOFOL;  Surgeon: Manya Silvas, MD;  Location: [facility] ENDOSCOPY;  Service: Endoscopy;  Laterality: N/A;   ESOPHAGOGASTRODUODENOSCOPY     ESOPHAGOGASTRODUODENOSCOPY N/A 06/10/2018   Procedure: ESOPHAGOGASTRODUODENOSCOPY (EGD);  Surgeon: Manya Silvas, MD;  Location: [facility] ENDOSCOPY;  Service: Endoscopy;  Laterality: N/A;   Social History:  reports that she has never smoked. She has never used smokeless tobacco. She reports that she does not drink alcohol and does not use drugs.  Allergies  Allergen Reactions   Duloxetine    Penicillins     RASH    Family History  Problem Relation Age of Onset   Breast cancer Neg Hx     Prior to Admission medications   Medication Sig Start Date End Date Taking? Authorizing Provider  alendronate (FOSAMAX) 70 MG tablet Take 1 tablet (70 mg total) by mouth every 7 (seven) days Take with a full glass of water.  Do not lie down for the next 30 min. 05/12/22 05/12/23 Yes [provider]  citalopram (CELEXA) 10 MG tablet Take 10 mg by mouth daily.   Yes [provider]  gabapentin (NEURONTIN) 300 MG capsule Take by mouth.   Yes [provider]  ALPRAZolam Duanne Moron) 0.5 MG tablet Take 0.5 mg by mouth at bedtime as needed for anxiety.    [provider]  baclofen (LIORESAL) 10 MG tablet Take 10 mg by mouth 2 (two) times daily.    [provider]  calcium-vitamin D (OSCAL WITH D) 500-200 MG-UNIT tablet Take 1 tablet by mouth 2 (two) times daily.    [provider]  cyclobenzaprine (FLEXERIL) 10 MG tablet Take 10 mg by mouth 3 (three) times daily as needed for muscle spasms.    [provider]  glimepiride (AMARYL) 4 MG tablet Take 4 mg by mouth daily with breakfast.    [provider]  Iron-Vitamin C (VITRON-C PO) Take by mouth 3 (three) times a week.    [provider]  losartan-hydrochlorothiazide (HYZAAR) 100-12.5 MG tablet Take 1 tablet by mouth daily.    [provider]  meclizine (ANTIVERT) 25 MG tablet Take 25 mg by mouth 3 (three) times daily as needed for dizziness.    [provider]  metFORMIN (GLUCOPHAGE) 500 MG tablet Take by mouth 2 (two) times daily with a meal.    [provider]  omeprazole (PRILOSEC) 20 MG capsule Take 20 mg by mouth 2 (two) times daily before a meal.    [provider]  OZEMPIC, 0.25 OR 0.5 MG/DOSE, 2 MG/3ML SOPN Inject into the skin.    [provider]  pravastatin (PRAVACHOL) 40 MG tablet Take 40 mg by mouth daily.    [provider]  traZODone (DESYREL) 50 MG tablet Take 50 mg by mouth at bedtime.    [provider]  verapamil (CALAN-SR) 180 MG CR tablet Take 180 mg by mouth at bedtime.    [provider]  vitamin B-12 (CYANOCOBALAMIN) 500 MCG tablet Take 500 mcg by mouth daily.    [provider]  zolpidem (AMBIEN) 10  MG tablet Take 10 mg by mouth at bedtime as needed for sleep.    [provider]    Physical Exam: Vitals:   06/08/22 1825 06/08/22 1826 06/08/22 1841 06/08/22 1945  BP: (!) 161/89   (!) 180/87  Pulse: (!) 104   97  Resp: (!) 22   15  Temp:   98.1 F (36.7 C)   TempSrc:   Oral   SpO2: 100%   98%  Weight:  62.5 kg    Height:  '5\' 6"'$  (1.676 m)     Physical Exam Vitals and nursing note reviewed.  Constitutional:      General: She is not in acute distress. HENT:  Head: Normocephalic and atraumatic.  Cardiovascular:     Rate and Rhythm: Normal rate and regular rhythm.     Heart sounds: Normal heart sounds.  Pulmonary:     Effort: Pulmonary effort is normal.     Breath sounds: Normal breath sounds.  Abdominal:     Palpations: Abdomen is soft.     Tenderness: There is no abdominal tenderness.  Neurological:     General: No focal deficit present.     Mental Status: Mental status is at baseline.     Labs on Admission: I have personally reviewed following labs and imaging studies  CBC: Recent Labs  Lab 06/08/22 1737  WBC 9.6  NEUTROABS 6.4  HGB 14.8  HCT 40.7  MCV 78.0*  PLT [id number]   Basic Metabolic Panel: Recent Labs  Lab 06/08/22 1737  NA 128*  K 2.7*  CL 89*  CO2 20*  GLUCOSE 294*  BUN 14  CREATININE 0.84  CALCIUM 9.7  MG 1.6*   GFR: Estimated Creatinine Clearance: 47.5 mL/min (by C-G formula based on SCr of 0.84 mg/dL). Liver Function Tests: Recent Labs  Lab 06/08/22 1737  AST 21  ALT 17  ALKPHOS 80  BILITOT 1.0  PROT 6.7  ALBUMIN 3.9   No results for input(s): "LIPASE", "AMYLASE" in the last 168 hours. No results for input(s): "AMMONIA" in the last 168 hours. Coagulation Profile: Recent Labs  Lab 06/08/22 1737  INR 1.0   Cardiac Enzymes: No results for input(s): "CKTOTAL", "CKMB", "CKMBINDEX", "TROPONINI" in the last 168 hours. BNP (last 3 results) No results for input(s): "PROBNP" in the last 8760 hours. HbA1C: No results  for input(s): "HGBA1C" in the last 72 hours. CBG: Recent Labs  Lab 06/08/22 1733  GLUCAP 299*   Lipid Profile: No results for input(s): "CHOL", "HDL", "LDLCALC", "TRIG", "CHOLHDL", "LDLDIRECT" in the last 72 hours. Thyroid Function Tests: No results for input(s): "TSH", "T4TOTAL", "FREET4", "T3FREE", "THYROIDAB" in the last 72 hours. Anemia Panel: No results for input(s): "VITAMINB12", "FOLATE", "FERRITIN", "TIBC", "IRON", "RETICCTPCT" in the last 72 hours. Urine analysis:    Component Value Date/Time   COLORURINE STRAW (A) 06/08/2022 1837   APPEARANCEUR CLEAR (A) 06/08/2022 1837   LABSPEC 1.018 06/08/2022 1837   PHURINE 7.0 06/08/2022 1837   GLUCOSEU >=500 (A) 06/08/2022 1837   HGBUR NEGATIVE 06/08/2022 1837   BILIRUBINUR NEGATIVE 06/08/2022 1837   KETONESUR 5 (A) 06/08/2022 1837   PROTEINUR NEGATIVE 06/08/2022 1837   NITRITE NEGATIVE 06/08/2022 1837   LEUKOCYTESUR NEGATIVE 06/08/2022 1837    Radiological Exams on Admission: MR BRAIN WO CONTRAST  Result Date: 06/08/2022 CLINICAL DATA:  Initial evaluation for neuro deficit, stroke suspected. EXAM: MRI HEAD WITHOUT CONTRAST TECHNIQUE: Multiplanar, multiecho pulse sequences of the brain and surrounding structures were obtained without intravenous contrast. COMPARISON:  Prior CTs from earlier the same day. FINDINGS: Brain: Generalized age-related cerebral atrophy. Patchy and confluent T2/FLAIR hyperintensity involving the periventricular deep white matter both cerebral hemispheres as well as the pons, most consistent with chronic small vessel ischemic disease, moderately advanced in nature. Few scattered remote lacunar infarcts present about the thalami. Chronic hemosiderin staining at the left frontal operculum, suspected to reflect a small remote left MCA distribution infarct. Few additional punctate chronic micro hemorrhages noted about the thalami, hypertensive in nature. No evidence for acute or subacute ischemia. Gray-white matter  differentiation otherwise maintained. No acute intracranial hemorrhage. 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 No other mass  lesion, midline shift or mass effect. No hydrocephalus or extra-axial fluid collection. Pituitary gland and suprasellar region within normal limits. Vascular: Major intracranial vascular flow voids are maintained. Skull and upper cervical spine: Craniocervical junction normal. Bone marrow signal intensity within normal limits. No scalp soft tissue abnormality. Sinuses/Orbits: Prior bilateral ocular lens replacement. Left maxillary sinus retention cyst noted. Paranasal sinuses are otherwise clear. Trace left mastoid effusion, of doubtful significance. Other: None. IMPRESSION: 1. No acute intracranial abnormality. 2. Age-related cerebral atrophy with moderately advanced chronic microvascular ischemic disease, with a few scattered remote lacunar infarcts about the thalami. 3. Chronic hemosiderin staining at the left frontal operculum, which could be related to prior hemorrhage and/or hemorrhagic infarct, stable. 4. 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 Electronically Signed   By: Jeannine Boga M.D.   On: 06/08/2022 21:23   CT ANGIO HEAD NECK W WO CM W PERF (CODE STROKE)  Addendum Date: 06/08/2022   ADDENDUM REPORT: 06/08/2022 20:36 ADDENDUM: CT perfusion imaging was also performed. The perfusion software identifies no core infarct. The perfusion software identifies a 3 mL region of hypoperfused parenchyma within the anterolateral right frontal lobe utilizing the Tmax>6 seconds threshold. Reported mismatch volume: 3 mL. These results were called by telephone at the time of interpretation on 06/08/2022 at 8:33 pm to provider Dr. Ellender Hose, who verbally acknowledged these results. Electronically Signed   By: Kellie Simmering D.O.   On: 06/08/2022 20:36   Result Date: 06/08/2022 CLINICAL  DATA:  Provided history: Neuro deficit, acute, stroke suspected. EXAM: CT ANGIOGRAPHY HEAD AND NECK TECHNIQUE: Multidetector CT imaging of the head and neck was performed using the standard protocol during bolus administration of intravenous contrast. Multiplanar CT image reconstructions and MIPs were obtained to evaluate the vascular anatomy. Carotid stenosis measurements (when applicable) are obtained utilizing NASCET criteria, using the distal internal carotid diameter as the denominator. RADIATION DOSE REDUCTION: This exam was performed according to the departmental dose-optimization program which includes automated exposure control, adjustment of the mA and/or kV according to patient size and/or use of iterative reconstruction technique. CONTRAST:  159m OMNIPAQUE IOHEXOL 350 MG/ML SOLN COMPARISON:  Noncontrast head CT performed earlier today 06/08/2022. FINDINGS: CTA NECK FINDINGS Aortic arch: Common origin of the innominate and left common carotid arteries. Atherosclerotic plaque within the visualized aortic arch and proximal major branch vessels of the neck. No hemodynamically significant innominate or proximal subclavian artery stenosis. Right carotid system: CCA and ICA patent within the neck. Atherosclerotic plaque about the carotid bifurcation and within the proximal ICA resulting in up to 60% stenosis of the proximal ICA. Left carotid system: CCA and ICA patent within the neck. Atherosclerotic plaque about the carotid bifurcation and within the proximal ICA resulting in less than 50% stenosis. Vertebral arteries: Vertebral arteries codominant and patent within the neck. Moderate severe narrowing of the V2 left vertebral artery at the C4-C5 level due to mass effect from degenerative spurring (series 6, image 200). No significant stenosis identified elsewhere within the cervical vertebral arteries. Skeleton: Cervical spondylosis. No acute fracture or aggressive osseous lesion. Other neck: Subcentimeter  nodule within the right thyroid lobe not meeting consensus criteria for ultrasound follow-up based on size. No follow-up imaging is recommended. Reference: J Am Coll Radiol. 2015 Feb;12(2): 143-50. Upper chest: No consolidation within the imaged lung apices. Review of the MIP images confirms the above findings CTA HEAD FINDINGS Anterior circulation: The intracranial internal carotid arteries are patent. Non-stenotic atherosclerotic plaque within both vessels. The  M1 middle cerebral arteries are patent. Atherosclerotic irregularity of the M2 and more distal MCA vessels, bilaterally. Most notably, there is a severe stenosis within an M3 right MCA vessel (series 11, image 12). The anterior cerebral arteries are patent. 1-2 mm inferiorly projecting vascular protrusion arising from the supraclinoid right ICA, which may reflect an aneurysm or infundibulum. Posterior circulation: The intracranial vertebral arteries are patent. The basilar artery is patent. The posterior cerebral arteries are patent. Posterior communicating arteries are diminutive or absent, bilaterally. Venous sinuses: Evaluation of the dural venous sinuses is limited by contrast timing. Anatomic variants: As described Other: Large mucous retention cyst within the left maxillary sinus. Review of the MIP images confirms the above findings No emergent large vessel occlusion identified. These results were called by telephone at the time of interpretation on 06/08/2022 at 7:23 pm to provider Dr. Charna Archer, who verbally acknowledged these results. IMPRESSION: CTA neck: 1. The common carotid and internal carotid arteries are patent. Atherosclerotic plaque bilaterally, as described. Most notably, atherosclerotic plaque results in up to 60% stenosis of the proximal right ICA. 2. Vertebral arteries patent within the neck. Moderate/severe narrowing of the V2 left vertebral artery at the C4-C5 level due to mass effect from degenerative spurring. 3.  Aortic  Atherosclerosis (ICD10-I70.0). CTA head: 1. No intracranial large vessel occlusion is identified. 2. Intracranial atherosclerotic disease. Most notably, there is a severe stenosis within an M3 right MCA vessel. Electronically Signed: By: Kellie Simmering D.O. On: 06/08/2022 19:24   CT ABDOMEN PELVIS WO CONTRAST  Result Date: 06/08/2022 CLINICAL DATA:  Generalized weakness. EXAM: CT ABDOMEN AND PELVIS WITHOUT CONTRAST TECHNIQUE: Multidetector CT imaging of the abdomen and pelvis was performed following the standard protocol without IV contrast. RADIATION DOSE REDUCTION: This exam was performed according to the departmental dose-optimization program which includes automated exposure control, adjustment of the mA and/or kV according to patient size and/or use of iterative reconstruction technique. COMPARISON:  November 11, 2013 FINDINGS: Lower chest: No acute abnormality. Hepatobiliary: No focal liver abnormality is seen. No gallstones, gallbladder wall thickening, or biliary dilatation. Pancreas: Unremarkable. No pancreatic ductal dilatation or surrounding inflammatory changes. Spleen: Normal in size without focal abnormality. Adrenals/Urinary Tract: Adrenal glands are unremarkable. Kidneys are normal in size, without renal calculi, focal lesion, or hydronephrosis. Contrast is seen throughout the lumen of a moderately distended urinary bladder. Stomach/Bowel: There is a small hiatal hernia. Appendix appears normal. No evidence of bowel wall thickening, distention, or inflammatory changes. Vascular/Lymphatic: Aortic atherosclerosis. No enlarged abdominal or pelvic lymph nodes. Reproductive: Status post hysterectomy. No adnexal masses. Other: No abdominal wall hernia or abnormality. No abdominopelvic ascites. Musculoskeletal: Multilevel degenerative changes seen throughout the lumbar spine. IMPRESSION: 1. No evidence of acute or active process within the abdomen or pelvis. 2. Small hiatal hernia. 3. Aortic atherosclerosis.  Aortic Atherosclerosis (ICD10-I70.0). Electronically Signed   By: Virgina Norfolk M.D.   On: 06/08/2022 19:40   DG Chest Portable 1 View  Result Date: 06/08/2022 CLINICAL DATA:  Weakness, confusion EXAM: PORTABLE CHEST 1 VIEW COMPARISON:  None Available. FINDINGS: The heart size and mediastinal contours are within normal limits. Both lungs are clear. The visualized skeletal structures are unremarkable. IMPRESSION: No acute abnormality of the lungs in AP portable projection. Electronically Signed   By: Delanna Ahmadi M.D.   On: 06/08/2022 19:22   CT HEAD CODE STROKE WO CONTRAST  Result Date: 06/08/2022 CLINICAL DATA:  Code stroke. Neuro deficit, acute, stroke suspected. Slurred speech. Confusion. EXAM: CT HEAD WITHOUT CONTRAST TECHNIQUE:  Contiguous axial images were obtained from the base of the skull through the vertex without intravenous contrast. RADIATION DOSE REDUCTION: This exam was performed according to the departmental dose-optimization program which includes automated exposure control, adjustment of the mA and/or kV according to patient size and/or use of iterative reconstruction technique. COMPARISON:  MRI brain and MRV head 12/29/2015.  Head CT 09/25/2021. FINDINGS: Brain: Mild cerebral atrophy. Moderate patchy and ill-defined hypoattenuation within the cerebral white matter, nonspecific but compatible with chronic small vessel immediately. Chronic small-vessel image changes within the bilateral deep gray nuclei, including a chronic lacunar infarct within the right thalamus. A known small meningioma along the right petrous ridge was better appreciated on the prior brain MRI of 12/29/2015. There is no acute intracranial hemorrhage. No demarcated cortical infarct. No extra-axial fluid collection. No midline shift. Vascular: No hyperdense vessel.  Atherosclerotic calcifications. Skull: No fracture or aggressive osseous lesion. Sinuses/Orbits: No mass or acute finding within the imaged orbits. No  significant paranasal sinus disease at the imaged levels. ASPECTS [facility] Stroke Program Early CT Score) - Ganglionic level infarction (caudate, lentiform nuclei, internal capsule, insula, M1-M3 cortex): 7 - Supraganglionic infarction (M4-M6 cortex): 3 Total score (0-10 with 10 being normal): 10 No evidence of an acute intracranial abnormality. These results were called by telephone at the time of interpretation on 06/08/2022 at 5:55 pm to provider [facility] , who verbally acknowledged these results. IMPRESSION: 1.  No evidence of an acute intracranial abnormality. 2. Parenchymal atrophy and chronic small vessel disease, as described. 3. A known small meningioma along the right petrous ridge was better appreciated on the prior brain MRI of 12/29/2015. Electronically Signed   By: Kellie Simmering D.O.   On: 06/08/2022 17:55     Data Reviewed: Relevant notes from primary care and specialist visits, past discharge summaries as available in EHR, including Care Everywhere. Prior diagnostic testing as pertinent to current admission diagnoses Updated medications and problem lists for reconciliation ED course, including vitals, labs, imaging, treatment and response to treatment Triage notes, nursing and pharmacy notes and ED provider's notes Notable results as noted in HPI   Assessment and Plan: * Acute focal neurological deficit Acute metabolic encephalopathy Acute CVA ruled out with CT head and MRI brain however did show severe stenosis within an M3 right MCA vessel Patient presented with a several day history of weakness and confusion that became acutely worse on the day of arrival, resplved by the time of assessment Etiology likely related to electrolyte disturbance and side effects of new medication Ozempic and possibly Cymbalta Neurologic checks with fall and aspiration precautions TSH, B12 and ammonia added on as requested by neurology Monitor and correct electrolyte disturbance Hold suspected  offending medication, Ozempic and Cymbalta  Hyponatremia Hypovolemic related to diarrhea from suspected medication side effect IV hydration with NS and monitor  Hypomagnesemia Suspect related to medication side effect Received magnesium in the ED Continue to monitor and replete as necessary  Hypokalemia Suspect secondary to diarrhea/medication side effect, as well as HCTZ Repleted in the ED Continue to monitor and replete as necessary  Diarrhea Suspect related to medication side effect GI panel to check for infection  Uncontrolled type 2 diabetes mellitus with hyperglycemia, without long-term current use of insulin ([place]) Blood sugar 294 Patient recently switched from Actos to [place] for blood pressure control DC Ozempic due to suspected side effect Sliding scale coverage Will hold metformin due to recent contrasted study Consider switching from glimepiride to a different agent in view  of patient's age  Spinal stenosis of lumbosacral region Currently takes tramadol Will hold Cymbalta for now but should be okay to resume.  Patient was previously on Celexa  Hypertension Uncontrolled Continue losartan and verapamil  Coronary artery disease No complaints of chest pain Continue pravastatin and losartan     DVT prophylaxis: Lovenox  Consults: none  Advance Care Planning: DNR  Family Communication: [place]  Disposition Plan: Back to previous home environment  Severity of Illness: The appropriate patient status for this patient is OBSERVATION. Observation status is judged to be reasonable and necessary in order to provide the required intensity of service to ensure the patient's safety. The patient's presenting symptoms, physical exam findings, and initial radiographic and laboratory data in the context of their medical condition is felt to place them at decreased risk for further clinical deterioration. Furthermore, it is anticipated that the patient will  be medically stable for discharge from the hospital within 2 midnights of admission.   Author: Athena Masse, MD 06/08/2022 10:52 PM  For on call review [email or web address].

## 2022-06-08 NOTE — Assessment & Plan Note (Addendum)
Acute metabolic encephalopathy Acute CVA ruled out with CT head and MRI brain however did show severe stenosis within an M3 right MCA vessel Patient presented with a several day history of weakness and confusion that became acutely worse on the day of arrival, resplved by the time of assessment Etiology likely related to electrolyte disturbance and side effects of new medication Ozempic and possibly Cymbalta Neurologic checks with fall and aspiration precautions TSH, B12 and ammonia added on as requested by neurology Monitor and correct electrolyte disturbance Hold suspected offending medication, Ozempic and Cymbalta

## 2022-06-08 NOTE — ED Provider Notes (Signed)
[facility] Provider Note    Event Date/Time   First MD Initiated Contact with Patient 06/08/22 1828     (approximate)   History   Code Stroke   HPI  Brandy Oliver is a 82 y.o. female  here with increasing AMS over the past week. Pt reportedly has had progressive functional decline over the past few days, with increasing confusion. Earlier today, pt was at her 31 office when she had more acute latered mental status along with subtle L facial droop. She was brought to the ED and activated as a code stroke. On my assessment pt is confused, but denies any acute weakness, numbness. She does say she has had urinary sx. Family states she is much more confused than usual. Denies any recent med changes. No known recent fever, chills, or infectious sx. No recent falls.       Physical Exam   Triage Vital Signs: ED Triage Vitals  Enc Vitals Group     BP 06/08/22 1825 (!) 161/89     Pulse Rate 06/08/22 1825 (!) 104     Resp 06/08/22 1825 (!) 22     Temp --      Temp src --      SpO2 06/08/22 1825 100 %     Weight 06/08/22 1826 137 lb 12.6 oz (62.5 kg)     Height 06/08/22 1826 '5\' 6"'$  (1.676 m)     Head Circumference --      Peak Flow --      Pain Score --      Pain Loc --      Pain Edu? --      Excl. in [place]? --     Most recent vital signs: Vitals:   06/08/22 1841 06/08/22 1945  BP:  (!) 180/87  Pulse:  97  Resp:  15  Temp: 98.1 F (36.7 C)   SpO2:  98%     General: Awake, no distress.  CV:  Good peripheral perfusion. RRR. Resp:  Normal work of breathing.  Abd:  No distention. No tenderness. Other:  Dry MM. On neuro exam pt has subtle L NLF flattening, but otherwise intact cranial nerves. Strength 5/5 bl ue and le. Mild tremor noted bilaterally, course in UE. Normal sensation to light touch bl UE and LE.   ED Results / Procedures / Treatments   Labs (all labs ordered are listed, but only abnormal results are displayed) Labs Reviewed   CBC - Abnormal; Notable for the following components:      Result Value   RBC 5.22 (*)    MCV 78.0 (*)    MCHC 36.4 (*)    All other components within normal limits  COMPREHENSIVE METABOLIC PANEL - Abnormal; Notable for the following components:   Sodium 128 (*)    Potassium 2.7 (*)    Chloride 89 (*)    CO2 20 (*)    Glucose, Bld 294 (*)    Anion gap 19 (*)    All other components within normal limits  URINALYSIS, ROUTINE W REFLEX MICROSCOPIC - Abnormal; Notable for the following components:   Color, Urine STRAW (*)    APPearance CLEAR (*)    Glucose, UA >=500 (*)    Ketones, ur 5 (*)    All other components within normal limits  MAGNESIUM - Abnormal; Notable for the following components:   Magnesium 1.6 (*)    All other components within normal limits  CBG MONITORING, ED -  Abnormal; Notable for the following components:   Glucose-Capillary 299 (*)    All other components within normal limits  GASTROINTESTINAL PANEL BY PCR, STOOL (REPLACES STOOL CULTURE)  PROTIME-INR  APTT  DIFFERENTIAL  ETHANOL  LACTIC ACID, PLASMA  LACTIC ACID, PLASMA  HEMOGLOBIN A1C  MAGNESIUM  TSH  BASIC METABOLIC PANEL  CBC  AMMONIA  VITAMIN B12  CBG MONITORING, ED  TROPONIN I (HIGH SENSITIVITY)     EKG Sinus tachycardia, VR 103. PR 137, QRS 108, QTc 518. No acute st elevations or depressions. No ischemia or infarct.   RADIOLOGY CT Head; No acute abnormality MR Brain: Negative for acute abnormality CXR: Clear CT A/P: Negatiive  I also independently reviewed and agree with radiologist interpretations.   PROCEDURES:  Critical Care performed: Yes, see critical care procedure note(s)  .Critical Care  Performed by: Duffy Bruce, MD Authorized by: Duffy Bruce, MD   Critical care provider statement:    Critical care time (minutes):  30   Critical care time was exclusive of:  Separately billable procedures and treating other patients   Critical care was necessary to treat or  prevent imminent or life-threatening deterioration of the following conditions:  Cardiac failure, circulatory failure and CNS failure or compromise   Critical care was time spent personally by me on the following activities:  Development of treatment plan with patient or surrogate, discussions with consultants, evaluation of patient's response to treatment, examination of patient, ordering and review of laboratory studies, ordering and review of radiographic studies, ordering and performing treatments and interventions, pulse oximetry, re-evaluation of patient's condition and review of old charts     MEDICATIONS ORDERED IN ED: Medications  sodium chloride flush (NS) 0.9 % injection 3 mL (3 mLs Intravenous Not Given 06/08/22 1808)  potassium chloride 10 mEq in 100 mL IVPB (0 mEq Intravenous Stopped 06/08/22 2330)  citalopram (CELEXA) tablet 10 mg (has no administration in time range)  gabapentin (NEURONTIN) capsule 600 mg (600 mg Oral Given 06/08/22 2330)  enoxaparin (LOVENOX) injection 40 mg (40 mg Subcutaneous Given 06/08/22 2330)  acetaminophen (TYLENOL) tablet 650 mg (has no administration in time range)    Or  acetaminophen (TYLENOL) suppository 650 mg (has no administration in time range)  ondansetron (ZOFRAN) tablet 4 mg (has no administration in time range)    Or  ondansetron (ZOFRAN) injection 4 mg (has no administration in time range)  insulin aspart (novoLOG) injection 0-9 Units (has no administration in time range)  insulin aspart (novoLOG) injection 0-5 Units (has no administration in time range)  0.9 %  sodium chloride infusion ( Intravenous New Bag/Given 06/09/22 0052)  hydrALAZINE (APRESOLINE) injection 5 mg (has no administration in time range)  iohexol (OMNIPAQUE) 350 MG/ML injection 100 mL (100 mLs Intravenous Contrast Given 06/08/22 1756)  sodium chloride 0.9 % bolus 1,000 mL (0 mLs Intravenous Stopped 06/08/22 2115)  potassium chloride SA (KLOR-CON M) CR tablet 40 mEq (40 mEq  Oral Given 06/08/22 2134)  ondansetron (ZOFRAN) injection 4 mg (4 mg Intravenous Given 06/08/22 1946)  magnesium sulfate IVPB 2 g 50 mL (2 g Intravenous New Bag/Given 06/08/22 2330)     IMPRESSION / MDM / [place] / ED COURSE  I reviewed the triage vital signs and the nursing notes.                              Differential diagnosis includes, but is not limited to, CVA, TIA, seizure, polypharmacy,  occult infection such as UTI or PNA, anemia, metabolic/toxic encephalopathy  Patient's presentation is most consistent with acute presentation with potential threat to life or bodily function.  The patient is on the cardiac monitor to evaluate for evidence of arrhythmia and/or significant heart rate changes   82 yo F here with altered mental status. Activated as a code stroke but has no major focal neurological deficits. Dr. Leonel Ramsay at bedside and has recommended MRI, work-up for alternative etiologies. Labs, imaging reviewed. MRI negative for acute stroke. Labs show significant hypokalemia, hyponatremia. No leukocytosis. No signs of UTI. Suspect AMS in setting of polypharmacy as her sx correlate with starting duloxetine and ozempic, complicated by hyponatremia and dehydration. Will admit to medicine.    FINAL CLINICAL IMPRESSION(S) / ED DIAGNOSES   Final diagnoses:  Hyponatremia  Hypokalemia  Dehydration  Acute encephalopathy     Rx / DC Orders   ED Discharge Orders     None        Note:  This document was prepared using Dragon voice recognition software and may include unintentional dictation errors.   Duffy Bruce, MD 06/09/22 [phone]

## 2022-06-08 NOTE — IPAL (Signed)
  Interdisciplinary Goals of Care Family Meeting   Date carried out: 06/08/2022  Location of the meeting: Bedside  Member's involved: Physician and Family Member or next of kin Brandy Oliver, Brandy Oliver Power of Attorney or Loss adjuster, chartered: patient    Discussion: We discussed goals of care for Brandy Oliver .   I have reviewed medical records including EPIC notes, labs and imaging, assessed the patient and then met with patient and grandson at bedside to discuss major active diagnoses, plan of care, natural trajectory, prognosis, GOC, EOL wishes, disposition and options including Full code/DNI/DNR and the concept of comfort care if DNR is elected. Questions and concerns were addressed. They are   in agreement to continue current plan of care . Election for DNR status.   Code status: DNR  Disposition: Continue current acute care  Time spent for the meeting: [place], MD  06/08/2022, 11:56 PM

## 2022-06-08 NOTE — Consult Note (Signed)
Code stroke activated at 1741. Pt in CT at this time.   Neuro paged at 1743 and on camera at 1744.   Stroke cart malfunctioned and we had to switch to the other cart so time returned from CT unknown to TSRN.   No acute interventions needed per neuro. TSRN off cart at 1825.  Biagio Borg, Tel[place] RN

## 2022-06-08 NOTE — Assessment & Plan Note (Signed)
Suspect related to medication side effect GI panel to check for infection

## 2022-06-08 NOTE — ED Notes (Signed)
Patient is resting comfortably. 

## 2022-06-08 NOTE — Assessment & Plan Note (Signed)
Suspect related to medication side effect Received magnesium in the ED Continue to monitor and replete as necessary

## 2022-06-08 NOTE — ED Notes (Signed)
Code Stroke called at this time. Patient taken to CT by this RN.

## 2022-06-08 NOTE — Consult Note (Signed)
CODE STROKE- PHARMACY COMMUNICATION   Time CODE STROKE called/page received: 1739  Time response to CODE STROKE was made (in person or via phone): 1745  Time Stroke Kit retrieved from [place] (only if needed): not indicated per neurologist  Name of Provider/Nurse contacted: Roland Rack, MD  Past Medical History:  Diagnosis Date   Actinic keratosis    Cancer ([place])    skin    Cervical disc disease    Colon adenomas    Coronary artery disease    Diabetes mellitus without complication ([place])    Diarrhea    Dysphagia    GERD (gastroesophageal reflux disease)    Hyperlipidemia    Hypertension    IDA (iron deficiency anemia)    Meningioma ([facility])    Neuropathy    NSVT (nonsustained ventricular tachycardia) ([facility])    PUD (peptic ulcer disease)    Sleep apnea    Squamous cell carcinoma of skin 11/30/2016   Right lat. calf. SCCis. EDC.   Squamous cell carcinoma of skin 12/27/2017   Right dorsum wrist. WD SCC. EDC.   Squamous cell carcinoma of skin 10/30/2019   Left lat chin. SCCis   Prior to Admission medications   Medication Sig Start Date End Date Taking? Authorizing Provider  ALPRAZolam Duanne Moron) 0.5 MG tablet Take 0.5 mg by mouth at bedtime as needed for anxiety.    [provider]  baclofen (LIORESAL) 10 MG tablet Take 10 mg by mouth 2 (two) times daily.    [provider]  calcium-vitamin D (OSCAL WITH D) 500-200 MG-UNIT tablet Take 1 tablet by mouth 2 (two) times daily.    [provider]  citalopram (CELEXA) 10 MG tablet Take 10 mg by mouth daily.    [provider]  cyclobenzaprine (FLEXERIL) 10 MG tablet Take 10 mg by mouth 3 (three) times daily as needed for muscle spasms.    [provider]  gabapentin (NEURONTIN) 300 MG capsule Take by mouth.    [provider]  glimepiride (AMARYL) 4 MG tablet Take 4 mg by mouth daily with breakfast.    [provider]  Iron-Vitamin C (VITRON-C PO) Take by mouth 3  (three) times a week.    [provider]  losartan-hydrochlorothiazide (HYZAAR) 100-12.5 MG tablet Take 1 tablet by mouth daily.    [provider]  meclizine (ANTIVERT) 25 MG tablet Take 25 mg by mouth 3 (three) times daily as needed for dizziness.    [provider]  metFORMIN (GLUCOPHAGE) 500 MG tablet Take by mouth 2 (two) times daily with a meal.    [provider]  omeprazole (PRILOSEC) 20 MG capsule Take 20 mg by mouth 2 (two) times daily before a meal.    [provider]  OZEMPIC, 0.25 OR 0.5 MG/DOSE, 2 MG/3ML SOPN Inject into the skin.    [provider]  pravastatin (PRAVACHOL) 40 MG tablet Take 40 mg by mouth daily.    [provider]  traZODone (DESYREL) 50 MG tablet Take 50 mg by mouth at bedtime.    [provider]  verapamil (CALAN-SR) 180 MG CR tablet Take 180 mg by mouth at bedtime.    [provider]  vitamin B-12 (CYANOCOBALAMIN) 500 MCG tablet Take 500 mcg by mouth daily.    [provider]  zolpidem (AMBIEN) 10 MG tablet Take 10 mg by mouth at bedtime as needed for sleep.    [provider]    Gretel Acre, PharmD PGY1 Pharmacy Resident 06/08/2022 7:04  PM

## 2022-06-08 NOTE — ED Triage Notes (Signed)
Patient sent to ED from [facility] initially for potential medication interaction and urinary issues. While at [facility] patient developed sudden onset of slurred speech and confusion. [place] 1630. Patient answering questions but confusion noted.

## 2022-06-08 NOTE — ED Notes (Signed)
Called [facility] activated code stroke [phone]

## 2022-06-08 NOTE — Assessment & Plan Note (Signed)
Uncontrolled Continue losartan and verapamil

## 2022-06-08 NOTE — Assessment & Plan Note (Signed)
No complaints of chest pain Continue pravastatin and losartan

## 2022-06-08 NOTE — Assessment & Plan Note (Signed)
Blood sugar 294 Patient recently switched from Actos to Chacra for blood pressure control DC Ozempic due to suspected side effect Sliding scale coverage Will hold metformin due to recent contrasted study Consider switching from glimepiride to a different agent in view of patient's age

## 2022-06-08 NOTE — Assessment & Plan Note (Addendum)
Suspect secondary to diarrhea/medication side effect, as well as HCTZ Repleted in the ED Continue to monitor and replete as necessary

## 2022-06-09 ENCOUNTER — Encounter: Payer: Self-pay | Admitting: Internal Medicine

## 2022-06-09 DIAGNOSIS — T50995A Adverse effect of other drugs, medicaments and biological substances, initial encounter: Secondary | ICD-10-CM | POA: Diagnosis present

## 2022-06-09 DIAGNOSIS — I10 Essential (primary) hypertension: Secondary | ICD-10-CM | POA: Diagnosis present

## 2022-06-09 DIAGNOSIS — R29810 Facial weakness: Secondary | ICD-10-CM | POA: Diagnosis present

## 2022-06-09 DIAGNOSIS — I2510 Atherosclerotic heart disease of native coronary artery without angina pectoris: Secondary | ICD-10-CM | POA: Diagnosis present

## 2022-06-09 DIAGNOSIS — G9341 Metabolic encephalopathy: Secondary | ICD-10-CM | POA: Diagnosis present

## 2022-06-09 DIAGNOSIS — M4807 Spinal stenosis, lumbosacral region: Secondary | ICD-10-CM | POA: Diagnosis present

## 2022-06-09 DIAGNOSIS — Z85828 Personal history of other malignant neoplasm of skin: Secondary | ICD-10-CM | POA: Diagnosis not present

## 2022-06-09 DIAGNOSIS — D509 Iron deficiency anemia, unspecified: Secondary | ICD-10-CM | POA: Diagnosis present

## 2022-06-09 DIAGNOSIS — E1165 Type 2 diabetes mellitus with hyperglycemia: Secondary | ICD-10-CM | POA: Diagnosis present

## 2022-06-09 DIAGNOSIS — Z8711 Personal history of peptic ulcer disease: Secondary | ICD-10-CM | POA: Diagnosis not present

## 2022-06-09 DIAGNOSIS — R197 Diarrhea, unspecified: Secondary | ICD-10-CM | POA: Diagnosis present

## 2022-06-09 DIAGNOSIS — E876 Hypokalemia: Secondary | ICD-10-CM | POA: Diagnosis present

## 2022-06-09 DIAGNOSIS — Z66 Do not resuscitate: Secondary | ICD-10-CM | POA: Diagnosis present

## 2022-06-09 DIAGNOSIS — R29818 Other symptoms and signs involving the nervous system: Secondary | ICD-10-CM | POA: Diagnosis present

## 2022-06-09 DIAGNOSIS — R000 Tachycardia, unspecified: Secondary | ICD-10-CM | POA: Diagnosis present

## 2022-06-09 DIAGNOSIS — I447 Left bundle-branch block, unspecified: Secondary | ICD-10-CM | POA: Diagnosis present

## 2022-06-09 DIAGNOSIS — E1140 Type 2 diabetes mellitus with diabetic neuropathy, unspecified: Secondary | ICD-10-CM | POA: Diagnosis present

## 2022-06-09 DIAGNOSIS — F05 Delirium due to known physiological condition: Secondary | ICD-10-CM | POA: Diagnosis present

## 2022-06-09 DIAGNOSIS — E871 Hypo-osmolality and hyponatremia: Secondary | ICD-10-CM | POA: Diagnosis present

## 2022-06-09 DIAGNOSIS — Z1152 Encounter for screening for COVID-19: Secondary | ICD-10-CM | POA: Diagnosis not present

## 2022-06-09 DIAGNOSIS — E860 Dehydration: Secondary | ICD-10-CM | POA: Diagnosis present

## 2022-06-09 DIAGNOSIS — F329 Major depressive disorder, single episode, unspecified: Secondary | ICD-10-CM | POA: Diagnosis present

## 2022-06-09 DIAGNOSIS — E785 Hyperlipidemia, unspecified: Secondary | ICD-10-CM | POA: Diagnosis present

## 2022-06-09 LAB — CBG MONITORING, ED
Glucose-Capillary: 212 mg/dL — ABNORMAL HIGH (ref 70–99)
Glucose-Capillary: 326 mg/dL — ABNORMAL HIGH (ref 70–99)

## 2022-06-09 LAB — RESP PANEL BY RT-PCR (RSV, FLU A&B, COVID)  RVPGX2
Influenza A by PCR: NEGATIVE
Influenza B by PCR: NEGATIVE
Resp Syncytial Virus by PCR: NEGATIVE
SARS Coronavirus 2 by RT PCR: NEGATIVE

## 2022-06-09 LAB — LACTIC ACID, PLASMA: Lactic Acid, Venous: 0.9 mmol/L (ref 0.5–1.9)

## 2022-06-09 LAB — GLUCOSE, CAPILLARY
Glucose-Capillary: 163 mg/dL — ABNORMAL HIGH (ref 70–99)
Glucose-Capillary: 237 mg/dL — ABNORMAL HIGH (ref 70–99)

## 2022-06-09 LAB — TSH: TSH: 1.829 u[IU]/mL (ref 0.350–4.500)

## 2022-06-09 LAB — BASIC METABOLIC PANEL
Anion gap: 8 (ref 5–15)
BUN: 10 mg/dL (ref 8–23)
CO2: 26 mmol/L (ref 22–32)
Calcium: 8.7 mg/dL — ABNORMAL LOW (ref 8.9–10.3)
Chloride: 97 mmol/L — ABNORMAL LOW (ref 98–111)
Creatinine, Ser: 0.78 mg/dL (ref 0.44–1.00)
GFR, Estimated: >60 mL/min (ref >60)
Glucose, Bld: 223 mg/dL — ABNORMAL HIGH (ref 70–99)
Potassium: 3.1 mmol/L — ABNORMAL LOW (ref 3.5–5.1)
Sodium: 131 mmol/L — ABNORMAL LOW (ref 135–145)

## 2022-06-09 LAB — CBC
HCT: 37 % (ref 36.0–46.0)
Hemoglobin: 13.1 g/dL (ref 12.0–15.0)
MCH: 28.4 pg (ref 26.0–34.0)
MCHC: 35.4 g/dL (ref 30.0–36.0)
MCV: 80.1 fL (ref 80.0–100.0)
Platelets: 206 10*3/uL (ref 150–400)
RBC: 4.62 MIL/uL (ref 3.87–5.11)
RDW: 12.3 % (ref 11.5–15.5)
WBC: 7.8 10*3/uL (ref 4.0–10.5)
nRBC: 0 % (ref 0.0–0.2)

## 2022-06-09 LAB — VITAMIN B12: Vitamin B-12: 1022 pg/mL — ABNORMAL HIGH (ref 180–914)

## 2022-06-09 LAB — AMMONIA: Ammonia: 18 umol/L (ref 9–35)

## 2022-06-09 LAB — MAGNESIUM: Magnesium: 2.3 mg/dL (ref 1.7–2.4)

## 2022-06-09 MED ORDER — POTASSIUM CHLORIDE IN NACL 40-0.9 MEQ/L-% IV SOLN
INTRAVENOUS | Status: DC
  Filled 2022-06-09 (×3): qty 1000

## 2022-06-09 MED ORDER — ASPIRIN 81 MG PO TBEC
81.0000 mg | DELAYED_RELEASE_TABLET | Freq: Every day | ORAL | Status: DC
  Administered 2022-06-09 – 2022-06-10 (×2): 81 mg via ORAL
  Filled 2022-06-09 (×2): qty 1

## 2022-06-09 NOTE — ED Notes (Signed)
Grandson taking walker home.

## 2022-06-09 NOTE — Progress Notes (Signed)
PROGRESS NOTE    Brandy Oliver  [id number] DOB: [birth year] DOA: 06/08/2022 PCP: Rusty Aus, MD     Brief Narrative:   Brandy Oliver is a 82 y.o. female with medical history significant for Diabetes, hypertension, CAD, history of PUD, spinal stenosis, who presented to the ED as a code stroke from her PCPs office where she developed acute onset of confusion and a left facial droop.  Patient initially went to see her PCP with confusion, weakness as well as nausea with dry heaves and increased bowel movements that started over the past 3 to 4 days.  Patient had been started on Ozempic for diabetes control to her Actos and Cymbalta to replace her Celexa at her doctor's visit on 2/2 and symptoms started about a week later.  Most of the history is taken from the grandson at the bedside.  On the day patient went to see her doctor she had 6 bowel movements and while at the doctor's office she suddenly became weak and confused, arriving by EMS as a code stroke. Code stroke workup was essentially negative with negative CT head.  Brain MRI was nonacute.  CTA head and neck "most notably, there is a severe stenosis within an M3 right MCA vessel". Patient was seen in consultation by Teleneurology, Dr. Leonel Ramsay who did not recommend tPA due to being out of window and having NIH of 2.  He recommended stroke workup only if MRI was positive.  Recommended workup for delirium with CMP, UA, CBC, ammonia, TSH and B12.   Assessment & Plan:   Principal Problem:   Acute focal neurological deficit Active Problems:   Hyponatremia   Hypomagnesemia   Hypokalemia   Uncontrolled type 2 diabetes mellitus with hyperglycemia, without long-term current use of insulin (HCC)   Diarrhea   Coronary artery disease   Hypertension   Spinal stenosis of lumbosacral region  # Acute encephalopathy Appears resolved. No CVA on neuroimaging. Electrolyte derangements the likely culprit  # Loose stools Took immodium  yesterday, no bm since arrival. May have infectious process, may be ozempic side effect - gi pathogen panel and c diff if has loose/watery stool here - fluids - hold ozempic - PT eval  # Hyponatremia Baseline is normal, was 128 on admission, up to 131 today. Likely some component of volume depletion (recent diarrhea) and possible duloxetine side effect - duloxetine on hold  - gentle fluids  # Hypokalemia 2.7 on arrival, 3.1 today, mg normal. 2/2 diarrhea - replete  # Microvascular disease On neuroimaging. Reviewed with neurology, no further inpatient/outpt w/u needed - check ldl, was previously on statin but not currently - start baby aspirin  # Chronic low back pain - cont home gabapentin - duloxetine on hold as above  # T2DM - holding home metformin and ozempic, would hold the latter at d/c - SSI  # MDD - cont home celexa as sodium is improving   DVT prophylaxis: lovenox Code Status: dnr Family Communication: grandson updated telephonically 3/1  Level of care: Telemetry Medical Status is: Observation    Consultants:  none  Procedures: none  Antimicrobials:  none    Subjective: Feeling much better, no complaints  Objective: Vitals:   06/08/22 1945 06/09/22 0211 06/09/22 0524 06/09/22 0800  BP: (!) 180/87 (!) 157/68 117/65 108/63  Pulse: 97 65 94 99  Resp: '15 16 16 14  '$ Temp:   98.2 F (36.8 C)   TempSrc:   Oral   SpO2: 98% 95% 95% 94%  Weight:      Height:        Intake/Output Summary (Last 24 hours) at 06/09/2022 0929 Last data filed at 06/09/2022 0150 Gross per 24 hour  Intake 1250 ml  Output --  Net 1250 ml   Filed Weights   06/08/22 1826  Weight: 62.5 kg    Examination:  General exam: Appears calm and comfortable  Respiratory system: Clear to auscultation. Respiratory effort normal. Cardiovascular system: S1 & S2 heard, RRR. No JVD, murmurs, rubs, gallops or clicks. No pedal edema. Gastrointestinal system: Abdomen is nondistended,  soft and nontender. No organomegaly or masses felt. Normal bowel sounds heard. Central nervous system: Alert and oriented. No focal neurological deficits. Extremities: Symmetric 5 x 5 power. Skin: No rashes, lesions or ulcers Psychiatry: Judgement and insight appear normal. Mood & affect appropriate.     Data Reviewed: I have personally reviewed following labs and imaging studies  CBC: Recent Labs  Lab 06/08/22 1737 06/09/22 0515  WBC 9.6 7.8  NEUTROABS 6.4  --   HGB 14.8 13.1  HCT 40.7 37.0  MCV 78.0* 80.1  PLT 222 [id number]   Basic Metabolic Panel: Recent Labs  Lab 06/08/22 1737 06/09/22 0515  NA 128* 131*  K 2.7* 3.1*  CL 89* 97*  CO2 20* 26  GLUCOSE 294* 223*  BUN 14 10  CREATININE 0.84 0.78  CALCIUM 9.7 8.7*  MG 1.6* 2.3   GFR: Estimated Creatinine Clearance: 49.9 mL/min (by C-G formula based on SCr of 0.78 mg/dL). Liver Function Tests: Recent Labs  Lab 06/08/22 1737  AST 21  ALT 17  ALKPHOS 80  BILITOT 1.0  PROT 6.7  ALBUMIN 3.9   No results for input(s): "LIPASE", "AMYLASE" in the last 168 hours. Recent Labs  Lab 06/09/22 0516  AMMONIA 18   Coagulation Profile: Recent Labs  Lab 06/08/22 1737  INR 1.0   Cardiac Enzymes: No results for input(s): "CKTOTAL", "CKMB", "CKMBINDEX", "TROPONINI" in the last 168 hours. BNP (last 3 results) No results for input(s): "PROBNP" in the last 8760 hours. HbA1C: No results for input(s): "HGBA1C" in the last 72 hours. CBG: Recent Labs  Lab 06/08/22 1733 06/09/22 0733  GLUCAP 299* 212*   Lipid Profile: No results for input(s): "CHOL", "HDL", "LDLCALC", "TRIG", "CHOLHDL", "LDLDIRECT" in the last 72 hours. Thyroid Function Tests: Recent Labs    06/08/22 1758  TSH 1.829   Anemia Panel: No results for input(s): "VITAMINB12", "FOLATE", "FERRITIN", "TIBC", "IRON", "RETICCTPCT" in the last 72 hours. Urine analysis:    Component Value Date/Time   COLORURINE STRAW (A) 06/08/2022 1837   APPEARANCEUR CLEAR  (A) 06/08/2022 1837   LABSPEC 1.018 06/08/2022 1837   PHURINE 7.0 06/08/2022 1837   GLUCOSEU >=500 (A) 06/08/2022 1837   HGBUR NEGATIVE 06/08/2022 1837   BILIRUBINUR NEGATIVE 06/08/2022 1837   KETONESUR 5 (A) 06/08/2022 1837   PROTEINUR NEGATIVE 06/08/2022 1837   NITRITE NEGATIVE 06/08/2022 1837   LEUKOCYTESUR NEGATIVE 06/08/2022 1837   Sepsis Labs: '@LABRCNTIP'$ (procalcitonin:4,lacticidven:4)  )No results found for this or any previous visit (from the past 240 hour(s)).       Radiology Studies: MR BRAIN WO CONTRAST  Result Date: 06/08/2022 CLINICAL DATA:  Initial evaluation for neuro deficit, stroke suspected. EXAM: MRI HEAD WITHOUT CONTRAST TECHNIQUE: Multiplanar, multiecho pulse sequences of the brain and surrounding structures were obtained without intravenous contrast. COMPARISON:  Prior CTs from earlier the same day. FINDINGS: Brain: Generalized age-related cerebral atrophy. Patchy and confluent T2/FLAIR hyperintensity involving the periventricular deep white matter both  cerebral hemispheres as well as the pons, most consistent with chronic small vessel ischemic disease, moderately advanced in nature. Few scattered remote lacunar infarcts present about the thalami. Chronic hemosiderin staining at the left frontal operculum, suspected to reflect a small remote left MCA distribution infarct. Few additional punctate chronic micro hemorrhages noted about the thalami, hypertensive in nature. No evidence for acute or subacute ischemia. Gray-white matter differentiation otherwise maintained. No acute intracranial hemorrhage. 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 No other mass lesion, midline shift or mass effect. No hydrocephalus or extra-axial fluid collection. Pituitary gland and suprasellar region within normal limits. Vascular: Major intracranial vascular flow voids are maintained. Skull and upper cervical spine: Craniocervical  junction normal. Bone marrow signal intensity within normal limits. No scalp soft tissue abnormality. Sinuses/Orbits: Prior bilateral ocular lens replacement. Left maxillary sinus retention cyst noted. Paranasal sinuses are otherwise clear. Trace left mastoid effusion, of doubtful significance. Other: None. IMPRESSION: 1. No acute intracranial abnormality. 2. Age-related cerebral atrophy with moderately advanced chronic microvascular ischemic disease, with a few scattered remote lacunar infarcts about the thalami. 3. Chronic hemosiderin staining at the left frontal operculum, which could be related to prior hemorrhage and/or hemorrhagic infarct, stable. 4. 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 Electronically Signed   By: Jeannine Boga M.D.   On: 06/08/2022 21:23   CT ANGIO HEAD NECK W WO CM W PERF (CODE STROKE)  Addendum Date: 06/08/2022   ADDENDUM REPORT: 06/08/2022 20:36 ADDENDUM: CT perfusion imaging was also performed. The perfusion software identifies no core infarct. The perfusion software identifies a 3 mL region of hypoperfused parenchyma within the anterolateral right frontal lobe utilizing the Tmax>6 seconds threshold. Reported mismatch volume: 3 mL. These results were called by telephone at the time of interpretation on 06/08/2022 at 8:33 pm to provider Dr. Ellender Hose, who verbally acknowledged these results. Electronically Signed   By: Kellie Simmering D.O.   On: 06/08/2022 20:36   Result Date: 06/08/2022 CLINICAL DATA:  Provided history: Neuro deficit, acute, stroke suspected. EXAM: CT ANGIOGRAPHY HEAD AND NECK TECHNIQUE: Multidetector CT imaging of the head and neck was performed using the standard protocol during bolus administration of intravenous contrast. Multiplanar CT image reconstructions and MIPs were obtained to evaluate the vascular anatomy. Carotid stenosis measurements (when applicable) are obtained utilizing NASCET criteria, using the distal  internal carotid diameter as the denominator. RADIATION DOSE REDUCTION: This exam was performed according to the departmental dose-optimization program which includes automated exposure control, adjustment of the mA and/or kV according to patient size and/or use of iterative reconstruction technique. CONTRAST:  132m OMNIPAQUE IOHEXOL 350 MG/ML SOLN COMPARISON:  Noncontrast head CT performed earlier today 06/08/2022. FINDINGS: CTA NECK FINDINGS Aortic arch: Common origin of the innominate and left common carotid arteries. Atherosclerotic plaque within the visualized aortic arch and proximal major branch vessels of the neck. No hemodynamically significant innominate or proximal subclavian artery stenosis. Right carotid system: CCA and ICA patent within the neck. Atherosclerotic plaque about the carotid bifurcation and within the proximal ICA resulting in up to 60% stenosis of the proximal ICA. Left carotid system: CCA and ICA patent within the neck. Atherosclerotic plaque about the carotid bifurcation and within the proximal ICA resulting in less than 50% stenosis. Vertebral arteries: Vertebral arteries codominant and patent within the neck. Moderate severe narrowing of the V2 left vertebral artery at the C4-C5 level due to mass effect from  degenerative spurring (series 6, image 200). No significant stenosis identified elsewhere within the cervical vertebral arteries. Skeleton: Cervical spondylosis. No acute fracture or aggressive osseous lesion. Other neck: Subcentimeter nodule within the right thyroid lobe not meeting consensus criteria for ultrasound follow-up based on size. No follow-up imaging is recommended. Reference: J Am Coll Radiol. 2015 Feb;12(2): 143-50. Upper chest: No consolidation within the imaged lung apices. Review of the MIP images confirms the above findings CTA HEAD FINDINGS Anterior circulation: The intracranial internal carotid arteries are patent. Non-stenotic atherosclerotic plaque within  both vessels. The M1 middle cerebral arteries are patent. Atherosclerotic irregularity of the M2 and more distal MCA vessels, bilaterally. Most notably, there is a severe stenosis within an M3 right MCA vessel (series 11, image 12). The anterior cerebral arteries are patent. 1-2 mm inferiorly projecting vascular protrusion arising from the supraclinoid right ICA, which may reflect an aneurysm or infundibulum. Posterior circulation: The intracranial vertebral arteries are patent. The basilar artery is patent. The posterior cerebral arteries are patent. Posterior communicating arteries are diminutive or absent, bilaterally. Venous sinuses: Evaluation of the dural venous sinuses is limited by contrast timing. Anatomic variants: As described Other: Large mucous retention cyst within the left maxillary sinus. Review of the MIP images confirms the above findings No emergent large vessel occlusion identified. These results were called by telephone at the time of interpretation on 06/08/2022 at 7:23 pm to provider Dr. Charna Archer, who verbally acknowledged these results. IMPRESSION: CTA neck: 1. The common carotid and internal carotid arteries are patent. Atherosclerotic plaque bilaterally, as described. Most notably, atherosclerotic plaque results in up to 60% stenosis of the proximal right ICA. 2. Vertebral arteries patent within the neck. Moderate/severe narrowing of the V2 left vertebral artery at the C4-C5 level due to mass effect from degenerative spurring. 3.  Aortic Atherosclerosis (ICD10-I70.0). CTA head: 1. No intracranial large vessel occlusion is identified. 2. Intracranial atherosclerotic disease. Most notably, there is a severe stenosis within an M3 right MCA vessel. Electronically Signed: By: Kellie Simmering D.O. On: 06/08/2022 19:24   CT ABDOMEN PELVIS WO CONTRAST  Result Date: 06/08/2022 CLINICAL DATA:  Generalized weakness. EXAM: CT ABDOMEN AND PELVIS WITHOUT CONTRAST TECHNIQUE: Multidetector CT imaging of the  abdomen and pelvis was performed following the standard protocol without IV contrast. RADIATION DOSE REDUCTION: This exam was performed according to the departmental dose-optimization program which includes automated exposure control, adjustment of the mA and/or kV according to patient size and/or use of iterative reconstruction technique. COMPARISON:  November 11, 2013 FINDINGS: Lower chest: No acute abnormality. Hepatobiliary: No focal liver abnormality is seen. No gallstones, gallbladder wall thickening, or biliary dilatation. Pancreas: Unremarkable. No pancreatic ductal dilatation or surrounding inflammatory changes. Spleen: Normal in size without focal abnormality. Adrenals/Urinary Tract: Adrenal glands are unremarkable. Kidneys are normal in size, without renal calculi, focal lesion, or hydronephrosis. Contrast is seen throughout the lumen of a moderately distended urinary bladder. Stomach/Bowel: There is a small hiatal hernia. Appendix appears normal. No evidence of bowel wall thickening, distention, or inflammatory changes. Vascular/Lymphatic: Aortic atherosclerosis. No enlarged abdominal or pelvic lymph nodes. Reproductive: Status post hysterectomy. No adnexal masses. Other: No abdominal wall hernia or abnormality. No abdominopelvic ascites. Musculoskeletal: Multilevel degenerative changes seen throughout the lumbar spine. IMPRESSION: 1. No evidence of acute or active process within the abdomen or pelvis. 2. Small hiatal hernia. 3. Aortic atherosclerosis. Aortic Atherosclerosis (ICD10-I70.0). Electronically Signed   By: Virgina Norfolk M.D.   On: 06/08/2022 19:40   DG Chest Portable 1 View  Result Date: 06/08/2022 CLINICAL DATA:  Weakness, confusion EXAM: PORTABLE CHEST 1 VIEW COMPARISON:  None Available. FINDINGS: The heart size and mediastinal contours are within normal limits. Both lungs are clear. The visualized skeletal structures are unremarkable. IMPRESSION: No acute abnormality of the lungs in AP  portable projection. Electronically Signed   By: Delanna Ahmadi M.D.   On: 06/08/2022 19:22   CT HEAD CODE STROKE WO CONTRAST  Result Date: 06/08/2022 CLINICAL DATA:  Code stroke. Neuro deficit, acute, stroke suspected. Slurred speech. Confusion. EXAM: CT HEAD WITHOUT CONTRAST TECHNIQUE: Contiguous axial images were obtained from the base of the skull through the vertex without intravenous contrast. RADIATION DOSE REDUCTION: This exam was performed according to the departmental dose-optimization program which includes automated exposure control, adjustment of the mA and/or kV according to patient size and/or use of iterative reconstruction technique. COMPARISON:  MRI brain and MRV head 12/29/2015.  Head CT 09/25/2021. FINDINGS: Brain: Mild cerebral atrophy. Moderate patchy and ill-defined hypoattenuation within the cerebral white matter, nonspecific but compatible with chronic small vessel immediately. Chronic small-vessel image changes within the bilateral deep gray nuclei, including a chronic lacunar infarct within the right thalamus. A known small meningioma along the right petrous ridge was better appreciated on the prior brain MRI of 12/29/2015. There is no acute intracranial hemorrhage. No demarcated cortical infarct. No extra-axial fluid collection. No midline shift. Vascular: No hyperdense vessel.  Atherosclerotic calcifications. Skull: No fracture or aggressive osseous lesion. Sinuses/Orbits: No mass or acute finding within the imaged orbits. No significant paranasal sinus disease at the imaged levels. ASPECTS [facility] Stroke Program Early CT Score) - Ganglionic level infarction (caudate, lentiform nuclei, internal capsule, insula, M1-M3 cortex): 7 - Supraganglionic infarction (M4-M6 cortex): 3 Total score (0-10 with 10 being normal): 10 No evidence of an acute intracranial abnormality. These results were called by telephone at the time of interpretation on 06/08/2022 at 5:55 pm to provider [facility]  , who verbally acknowledged these results. IMPRESSION: 1.  No evidence of an acute intracranial abnormality. 2. Parenchymal atrophy and chronic small vessel disease, as described. 3. A known small meningioma along the right petrous ridge was better appreciated on the prior brain MRI of 12/29/2015. Electronically Signed   By: Kellie Simmering D.O.   On: 06/08/2022 17:55        Scheduled Meds:  citalopram  10 mg Oral Daily   enoxaparin (LOVENOX) injection  40 mg Subcutaneous Q24H   gabapentin  600 mg Oral QHS   insulin aspart  0-5 Units Subcutaneous QHS   insulin aspart  0-9 Units Subcutaneous TID WC   sodium chloride flush  3 mL Intravenous Once   Continuous Infusions:   LOS: 0 days     Desma Maxim, MD [facility]   If 7PM-7AM, please contact night-coverage [email or web address] Password [facility] 06/09/2022, 9:29 AM

## 2022-06-09 NOTE — ED Notes (Signed)
Patient ambulates to bathroom with assistance of nurse and assisted back to bed.

## 2022-06-10 LAB — LIPID PANEL
Cholesterol: 156 mg/dL (ref 0–200)
HDL: 46 mg/dL (ref >40)
LDL Cholesterol: 78 mg/dL (ref 0–99)
Total CHOL/HDL Ratio: 3.4 RATIO
Triglycerides: 160 mg/dL — ABNORMAL HIGH (ref <150)
VLDL: 32 mg/dL (ref 0–40)

## 2022-06-10 LAB — HEMOGLOBIN A1C
Hgb A1c MFr Bld: 10.6 % — ABNORMAL HIGH (ref 4.8–5.6)
Mean Plasma Glucose: 258 mg/dL

## 2022-06-10 LAB — BASIC METABOLIC PANEL
Anion gap: 7 (ref 5–15)
BUN: 12 mg/dL (ref 8–23)
CO2: 26 mmol/L (ref 22–32)
Calcium: 8.9 mg/dL (ref 8.9–10.3)
Chloride: 102 mmol/L (ref 98–111)
Creatinine, Ser: 0.94 mg/dL (ref 0.44–1.00)
GFR, Estimated: >60 mL/min (ref >60)
Glucose, Bld: 137 mg/dL — ABNORMAL HIGH (ref 70–99)
Potassium: 4 mmol/L (ref 3.5–5.1)
Sodium: 135 mmol/L (ref 135–145)

## 2022-06-10 LAB — MAGNESIUM: Magnesium: 1.9 mg/dL (ref 1.7–2.4)

## 2022-06-10 LAB — GLUCOSE, CAPILLARY: Glucose-Capillary: 234 mg/dL — ABNORMAL HIGH (ref 70–99)

## 2022-06-10 MED ORDER — PRAVASTATIN SODIUM 40 MG PO TABS: 40.0000 mg | ORAL_TABLET | Freq: Every day | ORAL | 1 refills | Status: AC

## 2022-06-10 MED ORDER — ASPIRIN 81 MG PO TBEC: 81.0000 mg | DELAYED_RELEASE_TABLET | Freq: Every day | ORAL | 12 refills | Status: AC

## 2022-06-10 NOTE — Evaluation (Signed)
Physical Therapy Evaluation Patient Details Name: Brandy Oliver MRN: [id number] DOB: [birth year] Today's Date: 06/10/2022  History of Present Illness  Pt is an 82 y.o. female presenting to hospital 06/08/22 with increasing AMS past week; increased urinary and bowel frequency, generalized weakness, acute AMS and subtle L facial droop at physicians office.  Code stroke activated in ED.  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  PMH includes DM, htn, CAD, h/o PUD, spinal stenosis.  Clinical Impression  Prior to recent weakness, pt was independent with ambulation within home; used [facility] in community; lives alone in 1 level home with ramp to enter; has family assist PRN.  Currently pt is SBA with transfers and CGA progressing to SBA ambulating 200 feet (no AD use).  No loss of balance noted during sessions activities.  Pt would currently benefit from skilled PT to address noted impairments and functional limitations (see below for any additional details).  Upon hospital discharge, no further PT needs anticipated (pt reporting she does not feel she needs further therapy upon hospital discharge).    Recommendations for follow up therapy are one component of a multi-disciplinary discharge planning process, led by the attending physician.  Recommendations may be updated based on patient status, additional functional criteria and insurance authorization.  Follow Up Recommendations No PT follow up      Assistance Recommended at Discharge PRN  Patient can return home with the following  Assistance with cooking/housework;Assist for transportation;Help with stairs or ramp for entrance    Equipment Recommendations None recommended by PT  Recommendations for Other Services       Functional Status Assessment Patient has had a recent  decline in their functional status and demonstrates the ability to make significant improvements in function in a reasonable and predictable amount of time.     Precautions / Restrictions Precautions Precautions: Fall Restrictions Weight Bearing Restrictions: No      Mobility  Bed Mobility               General bed mobility comments: Deferred (pt sitting in recliner beginning/end of session and reports no difficulty getting OOB earlier with nursing)    Transfers Overall transfer level: Needs assistance Equipment used: None Transfers: Sit to/from Stand Sit to Stand: Supervision           General transfer comment: fairly strong stand from recliner    Ambulation/Gait Ambulation/Gait assistance: Min guard, Supervision Gait Distance (Feet): 200 Feet Assistive device: None Gait Pattern/deviations: Step-through pattern Gait velocity: mildly decreased     General Gait Details: steady ambulation  Stairs            Wheelchair Mobility    Modified Rankin (Stroke Patients Only)       Balance Overall balance assessment: Needs assistance Sitting-balance support: No upper extremity supported, Feet supported Sitting balance-Leahy Scale: Normal Sitting balance - Comments: steady sitting reaching outside BOS   Standing balance support: No upper extremity supported, During functional activity Standing balance-Leahy Scale: Good Standing balance comment: no loss of balance with standing/ambulation activities during session                             Pertinent Vitals/Pain Pain Assessment Pain Assessment: Faces Faces Pain Scale: Hurts a little bit Pain Location: chronic LBP Pain Descriptors / Indicators: Sore Pain Intervention(s):  Limited activity within patient's tolerance, Monitored during session, Repositioned    Home Living Family/patient expects to be discharged to:: Private residence Living Arrangements: Alone Available Help at Discharge:  Family;Available PRN/intermittently (family lives next door and can assist PRN) Type of Home: House Home Access: Ramped entrance       Home Layout: One level;Other (Comment) (does not need to go to basement) Home Equipment: Cane - single point;Shower seat - built in;Grab bars - tub/shower;Grab bars - toilet;Rollator (4 wheels);Rolling Walker (2 wheels);Wheelchair - manual;Shower seat;BSC/3in1      Prior Function Prior Level of Function : Independent/Modified Independent             Mobility Comments: Independent with ambulation within home; uses [facility] in community.       Hand Dominance        Extremity/Trunk Assessment   Upper Extremity Assessment Upper Extremity Assessment: Overall WFL for tasks assessed    Lower Extremity Assessment Lower Extremity Assessment: Generalized weakness    Cervical / Trunk Assessment Cervical / Trunk Assessment: Normal  Communication   Communication: No difficulties  Cognition Arousal/Alertness: Awake/alert Behavior During Therapy: WFL for tasks assessed/performed Overall Cognitive Status: Within Functional Limits for tasks assessed                                 General Comments: A&Ox4        General Comments  Nursing cleared pt for participation in physical therapy.  Pt agreeable to PT session.    Exercises     Assessment/Plan    PT Assessment Patient needs continued PT services  PT Problem List Decreased strength;Decreased mobility       PT Treatment Interventions DME instruction;Gait training;Functional mobility training;Therapeutic activities;Therapeutic exercise;Balance training;Patient/family education    PT Goals (Current goals can be found in the Care Plan section)  Acute Rehab PT Goals Patient Stated Goal: to go home today PT Goal Formulation: With patient Time For Goal Achievement: 06/24/22 Potential to Achieve Goals: Good    Frequency Min 2X/week     Co-evaluation                AM-PAC PT "6 Clicks" Mobility  Outcome Measure Help needed turning from your back to your side while in a flat bed without using bedrails?: None Help needed moving from lying on your back to sitting on the side of a flat bed without using bedrails?: None Help needed moving to and from a bed to a chair (including a wheelchair)?: A Little Help needed standing up from a chair using your arms (e.g., wheelchair or bedside chair)?: A Little Help needed to walk in hospital room?: A Little Help needed climbing 3-5 steps with a railing? : A Little 6 Click Score: 20    End of Session Equipment Utilized During Treatment: Gait belt Activity Tolerance: Patient tolerated treatment well Patient left: in chair;with call bell/phone within reach;with chair alarm set Nurse Communication: Mobility status;Precautions PT Visit Diagnosis: Muscle weakness (generalized) (M62.81)    Time: [id number] PT Time Calculation (min) (ACUTE ONLY): 17 min   Charges:   PT Evaluation $PT Eval Low Complexity: 1 Low PT Treatments $Therapeutic Exercise: 8-22 mins       Leitha Bleak, PT 06/10/22, 12:11 PM

## 2022-06-10 NOTE — Discharge Summary (Signed)
Brandy Oliver [id number] DOB: 08/06/1940 DOA: 06/08/2022  PCP: Rusty Aus, MD  Admit date: 06/08/2022 Discharge date: 06/10/2022  Time spent: 35 minutes  Recommendations for Outpatient Follow-up:  Pcp f/u 1 week, check electrolytes then     Discharge Diagnoses:  Principal Problem:   Acute focal neurological deficit Active Problems:   Hyponatremia   Hypomagnesemia   Hypokalemia   Uncontrolled type 2 diabetes mellitus with hyperglycemia, without long-term current use of insulin (HCC)   Diarrhea   Coronary artery disease   Hypertension   Spinal stenosis of lumbosacral region   Discharge Condition: improved  Diet recommendation: heart healthy  Filed Weights   06/08/22 1826  Weight: 62.5 kg    History of present illness:  From admission h and p by dr. Damita Dunnings: [name] is a 82 y.o. female with medical history significant for Diabetes, hypertension, CAD, history of PUD, spinal stenosis, who presented to the ED as a code stroke from her PCPs office where she developed acute onset of confusion and a left facial droop.  Patient initially went to see her PCP with confusion, weakness as well as nausea with dry heaves and increased bowel movements that started over the past 3 to 4 days.  Patient had been started on Ozempic for diabetes control to her Actos and Cymbalta to replace her Celexa at her doctor's visit on 2/2 and symptoms started about a week later.  Most of the history is taken from the grandson at the bedside.  On the day patient went to see her doctor she had 6 bowel movements and while at the doctor's office she suddenly became weak and confused, arriving by EMS as a code stroke. Code stroke workup was essentially negative with negative CT head.  Brain MRI was nonacute.  CTA head and neck "most notably, there is a severe stenosis within an M3 right MCA vessel". Patient was seen in consultation by Teleneurology, Dr. Leonel Ramsay who did not recommend tPA due to  being out of window and having NIH of 2.  He recommended stroke workup only if MRI was positive.  Recommended workup for delirium with CMP, UA, CBC, ammonia, TSH and B12. Additional ED workup and data review: BP at admission 180/87 with otherwise normal vitals.  Notable labs include sodium 128, potassium 2.7 and magnesium 1.6, glucose 294 EKG, personally viewed and interpreted showed sinus tachycardia at 103 with multiple PVCs and incomplete LBBB CT abdomen and pelvis without contrast showed no acute findings. Patient received oral and IV potassium repletion, IV magnesium as well as an NS fluid bolus in the ED along with ondansetron and hospitalist consulted for admission.      Hospital Course:   # Acute encephalopathy Resolved. No CVA on neuroimaging. Electrolyte derangements the likely culprit   # Loose stools Likely ozempic side effect. Resolved with discontinuation of that med, no loose or watery stools here   # Hyponatremia Baseline is normal, was 128 on admission, up to 135 day of discharge. 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 Reviewed with neurology, no further inpatient/outpt w/u needed - re-start statin (was on previously but not currently) - start baby aspirin   # Chronic low back pain - cont home gabapentin - duloxetine on hold as above   # T2DM - ozempic on hold  Procedures: none   Consultations: neurology  Discharge Exam: Vitals:   06/10/22 0343 06/10/22 0831  BP: (!) 154/78 (!) 131/119  Pulse: 86 99  Resp: 19 16  Temp: 98.6 F (37 C) 98.4 F (36.9 C)  SpO2: 96% 100%    General: NAD Cardiovascular: RRR Respiratory: CTAB Ext: warm Abdomen: soft  Discharge Instructions   Discharge Instructions     Diet - low sodium heart healthy   Complete by:  As directed    Increase activity slowly   Complete by: As directed       Allergies as of 06/10/2022       Reactions   Duloxetine    Penicillins    RASH        Medication List     STOP taking these medications    ALPRAZolam 0.5 MG tablet Commonly known as: XANAX   baclofen 10 MG tablet Commonly known as: LIORESAL   calcium-vitamin D 500-200 MG-UNIT tablet Commonly known as: OSCAL WITH D   citalopram 10 MG tablet Commonly known as: CELEXA   cyclobenzaprine 10 MG tablet Commonly known as: FLEXERIL   glimepiride 4 MG tablet Commonly known as: AMARYL   losartan-hydrochlorothiazide 100-12.5 MG tablet Commonly known as: HYZAAR   meclizine 25 MG tablet Commonly known as: ANTIVERT   omeprazole 20 MG capsule Commonly known as: PRILOSEC   Ozempic (0.25 or 0.5 MG/DOSE) 2 MG/3ML Sopn Generic drug: Semaglutide(0.25 or 0.'5MG'$ /DOS)   verapamil 180 MG CR tablet Commonly known as: CALAN-SR   zolpidem 10 MG tablet Commonly known as: AMBIEN       TAKE these medications    alendronate 70 MG tablet Commonly known as: FOSAMAX Take 1 tablet (70 mg total) by mouth every 7 (seven) days Take with a full glass of water. Do not lie down for the next 30 min.   aspirin EC 81 MG tablet Take 1 tablet (81 mg total) by mouth daily. Swallow whole. Start taking on: June 11, 2022   gabapentin 300 MG capsule Commonly known as: NEURONTIN Take 600 mg by mouth at bedtime.   metFORMIN 500 MG tablet Commonly known as: GLUCOPHAGE Take by mouth 2 (two) times daily with a meal.   pravastatin 40 MG tablet Commonly known as: PRAVACHOL Take 1 tablet (40 mg total) by mouth daily.   traZODone 50 MG tablet Commonly known as: DESYREL Take 50 mg by mouth at bedtime.   vitamin B-12 500 MCG tablet Commonly known as: CYANOCOBALAMIN Take 500 mcg by mouth daily.   VITRON-C PO Take by mouth 3 (three) times a week.       Allergies  Allergen Reactions   Duloxetine    Penicillins      RASH    Follow-up Information     Rusty Aus, MD Follow up.   Specialty: Internal Medicine Contact information: [place] [address] [phone]                  The results of significant diagnostics from this hospitalization (including imaging, microbiology, ancillary and laboratory) are listed below for reference.    Significant Diagnostic Studies: MR BRAIN WO CONTRAST  Result Date: 06/08/2022 CLINICAL DATA:  Initial evaluation for neuro deficit, stroke suspected. EXAM: MRI HEAD WITHOUT CONTRAST TECHNIQUE: Multiplanar, multiecho pulse sequences of the brain and surrounding structures were obtained without intravenous contrast. COMPARISON:  Prior CTs from earlier the same day. FINDINGS: Brain: Generalized age-related cerebral atrophy. Patchy and confluent T2/FLAIR hyperintensity  involving the periventricular deep white matter both cerebral hemispheres as well as the pons, most consistent with chronic small vessel ischemic disease, moderately advanced in nature. Few scattered remote lacunar infarcts present about the thalami. Chronic hemosiderin staining at the left frontal operculum, suspected to reflect a small remote left MCA distribution infarct. Few additional punctate chronic micro hemorrhages noted about the thalami, hypertensive in nature. No evidence for acute or subacute ischemia. Gray-white matter differentiation otherwise maintained. No acute intracranial hemorrhage. 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 No other mass lesion, midline shift or mass effect. No hydrocephalus or extra-axial fluid collection. Pituitary gland and suprasellar region within normal limits. Vascular: Major intracranial vascular flow voids are maintained. Skull and upper cervical spine: Craniocervical junction normal. Bone marrow signal intensity within normal limits. No scalp  soft tissue abnormality. Sinuses/Orbits: Prior bilateral ocular lens replacement. Left maxillary sinus retention cyst noted. Paranasal sinuses are otherwise clear. Trace left mastoid effusion, of doubtful significance. Other: None. IMPRESSION: 1. No acute intracranial abnormality. 2. Age-related cerebral atrophy with moderately advanced chronic microvascular ischemic disease, with a few scattered remote lacunar infarcts about the thalami. 3. Chronic hemosiderin staining at the left frontal operculum, which could be related to prior hemorrhage and/or hemorrhagic infarct, stable. 4. 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 Electronically Signed   By: Jeannine Boga M.D.   On: 06/08/2022 21:23   CT ANGIO HEAD NECK W WO CM W PERF (CODE STROKE)  Addendum Date: 06/08/2022   ADDENDUM REPORT: 06/08/2022 20:36 ADDENDUM: CT perfusion imaging was also performed. The perfusion software identifies no core infarct. The perfusion software identifies a 3 mL region of hypoperfused parenchyma within the anterolateral right frontal lobe utilizing the Tmax>6 seconds threshold. Reported mismatch volume: 3 mL. These results were called by telephone at the time of interpretation on 06/08/2022 at 8:33 pm to provider Dr. Ellender Hose, who verbally acknowledged these results. Electronically Signed   By: Kellie Simmering D.O.   On: 06/08/2022 20:36   Result Date: 06/08/2022 CLINICAL DATA:  Provided history: Neuro deficit, acute, stroke suspected. EXAM: CT ANGIOGRAPHY HEAD AND NECK TECHNIQUE: Multidetector CT imaging of the head and neck was performed using the standard protocol during bolus administration of intravenous contrast. Multiplanar CT image reconstructions and MIPs were obtained to evaluate the vascular anatomy. Carotid stenosis measurements (when applicable) are obtained utilizing NASCET criteria, using the distal internal carotid diameter as the denominator. RADIATION DOSE REDUCTION: This  exam was performed according to the departmental dose-optimization program which includes automated exposure control, adjustment of the mA and/or kV according to patient size and/or use of iterative reconstruction technique. CONTRAST:  125m OMNIPAQUE IOHEXOL 350 MG/ML SOLN COMPARISON:  Noncontrast head CT performed earlier today 06/08/2022. FINDINGS: CTA NECK FINDINGS Aortic arch: Common origin of the innominate and left common carotid arteries. Atherosclerotic plaque within the visualized aortic arch and proximal major branch vessels of the neck. No hemodynamically significant innominate or proximal subclavian artery stenosis. Right carotid system: CCA and ICA patent within the neck. Atherosclerotic plaque about the carotid bifurcation and within the proximal ICA resulting in up to 60% stenosis of the proximal ICA. Left carotid system: CCA and ICA patent within the neck. Atherosclerotic plaque about the carotid bifurcation and within the proximal ICA resulting in less than 50% stenosis. Vertebral arteries: Vertebral arteries codominant and patent within the neck. Moderate severe narrowing of the V2 left vertebral artery at the  C4-C5 level due to mass effect from degenerative spurring (series 6, image 200). No significant stenosis identified elsewhere within the cervical vertebral arteries. Skeleton: Cervical spondylosis. No acute fracture or aggressive osseous lesion. Other neck: Subcentimeter nodule within the right thyroid lobe not meeting consensus criteria for ultrasound follow-up based on size. No follow-up imaging is recommended. Reference: J Am Coll Radiol. 2015 Feb;12(2): 143-50. Upper chest: No consolidation within the imaged lung apices. Review of the MIP images confirms the above findings CTA HEAD FINDINGS Anterior circulation: The intracranial internal carotid arteries are patent. Non-stenotic atherosclerotic plaque within both vessels. The M1 middle cerebral arteries are patent. Atherosclerotic  irregularity of the M2 and more distal MCA vessels, bilaterally. Most notably, there is a severe stenosis within an M3 right MCA vessel (series 11, image 12). The anterior cerebral arteries are patent. 1-2 mm inferiorly projecting vascular protrusion arising from the supraclinoid right ICA, which may reflect an aneurysm or infundibulum. Posterior circulation: The intracranial vertebral arteries are patent. The basilar artery is patent. The posterior cerebral arteries are patent. Posterior communicating arteries are diminutive or absent, bilaterally. Venous sinuses: Evaluation of the dural venous sinuses is limited by contrast timing. Anatomic variants: As described Other: Large mucous retention cyst within the left maxillary sinus. Review of the MIP images confirms the above findings No emergent large vessel occlusion identified. These results were called by telephone at the time of interpretation on 06/08/2022 at 7:23 pm to provider Dr. Charna Archer, who verbally acknowledged these results. IMPRESSION: CTA neck: 1. The common carotid and internal carotid arteries are patent. Atherosclerotic plaque bilaterally, as described. Most notably, atherosclerotic plaque results in up to 60% stenosis of the proximal right ICA. 2. Vertebral arteries patent within the neck. Moderate/severe narrowing of the V2 left vertebral artery at the C4-C5 level due to mass effect from degenerative spurring. 3.  Aortic Atherosclerosis (ICD10-I70.0). CTA head: 1. No intracranial large vessel occlusion is identified. 2. Intracranial atherosclerotic disease. Most notably, there is a severe stenosis within an M3 right MCA vessel. Electronically Signed: By: Kellie Simmering D.O. On: 06/08/2022 19:24   CT ABDOMEN PELVIS WO CONTRAST  Result Date: 06/08/2022 CLINICAL DATA:  Generalized weakness. EXAM: CT ABDOMEN AND PELVIS WITHOUT CONTRAST TECHNIQUE: Multidetector CT imaging of the abdomen and pelvis was performed following the standard protocol without  IV contrast. RADIATION DOSE REDUCTION: This exam was performed according to the departmental dose-optimization program which includes automated exposure control, adjustment of the mA and/or kV according to patient size and/or use of iterative reconstruction technique. COMPARISON:  November 11, 2013 FINDINGS: Lower chest: No acute abnormality. Hepatobiliary: No focal liver abnormality is seen. No gallstones, gallbladder wall thickening, or biliary dilatation. Pancreas: Unremarkable. No pancreatic ductal dilatation or surrounding inflammatory changes. Spleen: Normal in size without focal abnormality. Adrenals/Urinary Tract: Adrenal glands are unremarkable. Kidneys are normal in size, without renal calculi, focal lesion, or hydronephrosis. Contrast is seen throughout the lumen of a moderately distended urinary bladder. Stomach/Bowel: There is a small hiatal hernia. Appendix appears normal. No evidence of bowel wall thickening, distention, or inflammatory changes. Vascular/Lymphatic: Aortic atherosclerosis. No enlarged abdominal or pelvic lymph nodes. Reproductive: Status post hysterectomy. No adnexal masses. Other: No abdominal wall hernia or abnormality. No abdominopelvic ascites. Musculoskeletal: Multilevel degenerative changes seen throughout the lumbar spine. IMPRESSION: 1. No evidence of acute or active process within the abdomen or pelvis. 2. Small hiatal hernia. 3. Aortic atherosclerosis. Aortic Atherosclerosis (ICD10-I70.0). Electronically Signed   By: Virgina Norfolk M.D.   On: 06/08/2022 19:40  DG Chest Portable 1 View  Result Date: 06/08/2022 CLINICAL DATA:  Weakness, confusion EXAM: PORTABLE CHEST 1 VIEW COMPARISON:  None Available. FINDINGS: The heart size and mediastinal contours are within normal limits. Both lungs are clear. The visualized skeletal structures are unremarkable. IMPRESSION: No acute abnormality of the lungs in AP portable projection. Electronically Signed   By: Delanna Ahmadi M.D.    On: 06/08/2022 19:22   CT HEAD CODE STROKE WO CONTRAST  Result Date: 06/08/2022 CLINICAL DATA:  Code stroke. Neuro deficit, acute, stroke suspected. Slurred speech. Confusion. EXAM: CT HEAD WITHOUT CONTRAST TECHNIQUE: Contiguous axial images were obtained from the base of the skull through the vertex without intravenous contrast. RADIATION DOSE REDUCTION: This exam was performed according to the departmental dose-optimization program which includes automated exposure control, adjustment of the mA and/or kV according to patient size and/or use of iterative reconstruction technique. COMPARISON:  MRI brain and MRV head 12/29/2015.  Head CT 09/25/2021. FINDINGS: Brain: Mild cerebral atrophy. Moderate patchy and ill-defined hypoattenuation within the cerebral white matter, nonspecific but compatible with chronic small vessel immediately. Chronic small-vessel image changes within the bilateral deep gray nuclei, including a chronic lacunar infarct within the right thalamus. A known small meningioma along the right petrous ridge was better appreciated on the prior brain MRI of 12/29/2015. There is no acute intracranial hemorrhage. No demarcated cortical infarct. No extra-axial fluid collection. No midline shift. Vascular: No hyperdense vessel.  Atherosclerotic calcifications. Skull: No fracture or aggressive osseous lesion. Sinuses/Orbits: No mass or acute finding within the imaged orbits. No significant paranasal sinus disease at the imaged levels. ASPECTS [facility] Stroke Program Early CT Score) - Ganglionic level infarction (caudate, lentiform nuclei, internal capsule, insula, M1-M3 cortex): 7 - Supraganglionic infarction (M4-M6 cortex): 3 Total score (0-10 with 10 being normal): 10 No evidence of an acute intracranial abnormality. These results were called by telephone at the time of interpretation on 06/08/2022 at 5:55 pm to provider [facility] , who verbally acknowledged these results. IMPRESSION: 1.  No  evidence of an acute intracranial abnormality. 2. Parenchymal atrophy and chronic small vessel disease, as described. 3. A known small meningioma along the right petrous ridge was better appreciated on the prior brain MRI of 12/29/2015. Electronically Signed   By: Kellie Simmering D.O.   On: 06/08/2022 17:55    Microbiology: Recent Results (from the past 240 hour(s))  Resp panel by RT-PCR (RSV, Flu A&B, Covid) Anterior Nasal Swab     Status: None   Collection Time: 06/09/22  9:33 AM   Specimen: Anterior Nasal Swab  Result Value Ref Range Status   SARS Coronavirus 2 by RT PCR NEGATIVE NEGATIVE Final    Comment: (NOTE) SARS-CoV-2 target nucleic acids are NOT DETECTED.  The SARS-CoV-2 RNA is generally detectable in upper respiratory specimens during the acute phase of infection. The lowest concentration of SARS-CoV-2 viral copies this assay can detect is 138 copies/mL. A negative result does not preclude SARS-Cov-2 infection and should not be used as the sole basis for treatment or other patient management decisions. A negative result may occur with  improper specimen collection/handling, submission of specimen other than nasopharyngeal swab, presence of viral mutation(s) within the areas targeted by this assay, and inadequate number of viral copies(<138 copies/mL). A negative result must be combined with clinical observations, patient history, and epidemiological information. The expected result is Negative.  Fact Sheet for Patients:  EntrepreneurPulse.com.au  Fact Sheet for Healthcare Providers:  IncredibleEmployment.be  This test is no t yet  approved or cleared by the [place] and  has been authorized for detection and/or diagnosis of SARS-CoV-2 by FDA under an Emergency Use Authorization (EUA). This EUA will remain  in effect (meaning this test can be used) for the duration of the COVID-19 declaration under Section 564(b)(1) of the Act,  21 U.S.C.section 360bbb-3(b)(1), unless the authorization is terminated  or revoked sooner.       Influenza A by PCR NEGATIVE NEGATIVE Final   Influenza B by PCR NEGATIVE NEGATIVE Final    Comment: (NOTE) The Xpert Xpress SARS-CoV-2/FLU/RSV plus assay is intended as an aid in the diagnosis of influenza from Nasopharyngeal swab specimens and should not be used as a sole basis for treatment. Nasal washings and aspirates are unacceptable for Xpert Xpress SARS-CoV-2/FLU/RSV testing.  Fact Sheet for Patients: EntrepreneurPulse.com.au  Fact Sheet for Healthcare Providers: IncredibleEmployment.be  This test is not yet approved or cleared by the [place] FDA and has been authorized for detection and/or diagnosis of SARS-CoV-2 by FDA under an Emergency Use Authorization (EUA). This EUA will remain in effect (meaning this test can be used) for the duration of the COVID-19 declaration under Section 564(b)(1) of the Act, 21 U.S.C. section 360bbb-3(b)(1), unless the authorization is terminated or revoked.     Resp Syncytial Virus by PCR NEGATIVE NEGATIVE Final    Comment: (NOTE) Fact Sheet for Patients: EntrepreneurPulse.com.au  Fact Sheet for Healthcare Providers: IncredibleEmployment.be  This test is not yet approved or cleared by the [place] FDA and has been authorized for detection and/or diagnosis of SARS-CoV-2 by FDA under an Emergency Use Authorization (EUA). This EUA will remain in effect (meaning this test can be used) for the duration of the COVID-19 declaration under Section 564(b)(1) of the Act, 21 U.S.C. section 360bbb-3(b)(1), unless the authorization is terminated or revoked.  Performed at [facility], [address] [place], [place] [address]      Labs: Basic Metabolic Panel: Recent Labs  Lab 06/08/22 1737 06/09/22 0515 06/10/22 0504  NA 128* 131* 135  K 2.7* 3.1*  4.0  CL 89* 97* 102  CO2 20* 26 26  GLUCOSE 294* 223* 137*  BUN '14 10 12  '$ CREATININE 0.84 0.78 0.94  CALCIUM 9.7 8.7* 8.9  MG 1.6* 2.3 1.9   Liver Function Tests: Recent Labs  Lab 06/08/22 1737  AST 21  ALT 17  ALKPHOS 80  BILITOT 1.0  PROT 6.7  ALBUMIN 3.9   No results for input(s): "LIPASE", "AMYLASE" in the last 168 hours. Recent Labs  Lab 06/09/22 0516  AMMONIA 18   CBC: Recent Labs  Lab 06/08/22 1737 06/09/22 0515  WBC 9.6 7.8  NEUTROABS 6.4  --   HGB 14.8 13.1  HCT 40.7 37.0  MCV 78.0* 80.1  PLT 222 206   Cardiac Enzymes: No results for input(s): "CKTOTAL", "CKMB", "CKMBINDEX", "TROPONINI" in the last 168 hours. BNP: BNP (last 3 results) No results for input(s): "BNP" in the last 8760 hours.  ProBNP (last 3 results) No results for input(s): "PROBNP" in the last 8760 hours.  CBG: Recent Labs  Lab 06/09/22 0733 06/09/22 1130 06/09/22 1651 06/09/22 2212 06/10/22 0831  GLUCAP 212* 326* 163* 237* 234*       Signed:  Desma Maxim MD.  [facility] 06/10/2022, 10:13 AM

## 2022-06-10 NOTE — Plan of Care (Signed)
  Problem: Education: Goal: Knowledge of General Education information will improve Description: Including pain rating scale, medication(s)/side effects and non-pharmacologic comfort measures Outcome: Adequate for Discharge   Problem: Health Behavior/Discharge Planning: Goal: Ability to manage health-related needs will improve Outcome: Adequate for Discharge   Problem: Clinical Measurements: Goal: Ability to maintain clinical measurements within normal limits will improve Outcome: Adequate for Discharge Goal: Will remain free from infection Outcome: Adequate for Discharge Goal: Diagnostic test results will improve Outcome: Adequate for Discharge Goal: Respiratory complications will improve Outcome: Adequate for Discharge Goal: Cardiovascular complication will be avoided Outcome: Adequate for Discharge   Problem: Activity: Goal: Risk for activity intolerance will decrease Outcome: Adequate for Discharge   Problem: Nutrition: Goal: Adequate nutrition will be maintained Outcome: Adequate for Discharge   Problem: Coping: Goal: Level of anxiety will decrease Outcome: Adequate for Discharge   Problem: Elimination: Goal: Will not experience complications related to bowel motility Outcome: Adequate for Discharge Goal: Will not experience complications related to urinary retention Outcome: Adequate for Discharge   Problem: Pain Managment: Goal: General experience of comfort will improve Outcome: Adequate for Discharge   Problem: Safety: Goal: Ability to remain free from injury will improve Outcome: Adequate for Discharge   Problem: Skin Integrity: Goal: Risk for impaired skin integrity will decrease Outcome: Adequate for Discharge   Problem: Fluid Volume: Goal: Ability to maintain a balanced intake and output will improve Outcome: Adequate for Discharge   Problem: Skin Integrity: Goal: Risk for impaired skin integrity will decrease Outcome: Adequate for Discharge    Problem: Education: Goal: Knowledge of secondary prevention will improve (MUST DOCUMENT ALL) Outcome: Adequate for Discharge Goal: Knowledge of patient specific risk factors will improve Brandy Oliver N/A or DELETE if not current risk factor) Outcome: Adequate for Discharge   Problem: Self-Care: Goal: Ability to participate in self-care as condition permits will improve Outcome: Adequate for Discharge Goal: Verbalization of feelings and concerns over difficulty with self-care will improve Outcome: Adequate for Discharge Goal: Ability to communicate needs accurately will improve Outcome: Adequate for Discharge

## 2022-06-13 ENCOUNTER — Encounter: Payer: Self-pay | Admitting: Dermatology

## 2022-06-16 ENCOUNTER — Other Ambulatory Visit: Payer: Self-pay | Admitting: Internal Medicine

## 2022-06-16 DIAGNOSIS — S32030A Wedge compression fracture of third lumbar vertebra, initial encounter for closed fracture: Secondary | ICD-10-CM

## 2022-06-19 ENCOUNTER — Ambulatory Visit
Admission: RE | Admit: 2022-06-19 | Discharge: 2022-06-19 | Disposition: A | Discharge status: Home / Self Care | Payer: Medicare Other | Source: Ambulatory Visit | Attending: Internal Medicine | Admitting: Internal Medicine

## 2022-06-19 DIAGNOSIS — S32030A Wedge compression fracture of third lumbar vertebra, initial encounter for closed fracture: Secondary | ICD-10-CM | POA: Diagnosis present

## 2022-06-20 ENCOUNTER — Other Ambulatory Visit: Payer: Self-pay | Admitting: Internal Medicine

## 2022-06-20 DIAGNOSIS — S32030A Wedge compression fracture of third lumbar vertebra, initial encounter for closed fracture: Secondary | ICD-10-CM

## 2022-06-22 ENCOUNTER — Ambulatory Visit
Admission: RE | Admit: 2022-06-22 | Discharge: 2022-06-22 | Disposition: A | Discharge status: Home / Self Care | Payer: Medicare Other | Source: Ambulatory Visit | Attending: Internal Medicine | Admitting: Internal Medicine

## 2022-06-22 DIAGNOSIS — S32030A Wedge compression fracture of third lumbar vertebra, initial encounter for closed fracture: Secondary | ICD-10-CM

## 2022-06-22 HISTORY — PX: IR RADIOLOGIST EVAL & MGMT: IMG5224

## 2022-06-22 NOTE — Consult Note (Signed)
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    After discussion, they would like to proceed ASAP.    Plan: - Image guided treatment of lumbar compression fractures, both L3 and L5, given pain at each site, with vertebral augmentation/kyphoplasty. - Patient reports that she has already had testing for osteoporosis with the year.     Thank you for this interesting consult.  I greatly enjoyed meeting Brandy Oliver and look forward to participating in their care.  A copy of this report was sent to the requesting provider on this date.  Electronically Signed: Corrie Mckusick 06/22/2022, 2:50 PM   I spent a total of  60 Minutes   in face to face in clinical consultation, greater than 50% of which was counseling/coordinating care for symptomatic L3 and L5 compression fracture, life style limiting symptoms of pain, and possible vertebral augmentation.

## 2022-06-23 ENCOUNTER — Other Ambulatory Visit: Payer: Self-pay | Admitting: Internal Medicine

## 2022-06-23 DIAGNOSIS — M8000XA Age-related osteoporosis with current pathological fracture, unspecified site, initial encounter for fracture: Secondary | ICD-10-CM

## 2022-06-28 NOTE — Discharge Instructions (Signed)
Kyphoplasty Post Procedure Discharge Instructions  May resume a regular diet and any medications that you routinely take (including pain medications). However, if you are taking Aspirin or an anticoagulant/blood thinner you will be told when you can resume taking these by the healthcare provider. No driving day of procedure. The day of your procedure take it easy. You may use an ice pack as needed to injection sites on back.  Ice to back 30 minutes on and 30 minutes off, as needed. May remove bandaids tomorrow after taking a shower. Replace daily with a clean bandaid until healed.  Do not lift anything heavier than a milk jug for 1-2 weeks or determined by your physician.  Follow up with your physician in 2 weeks.    Please contact our office at [phone] for the following symptoms or if you have any questions:  Fever greater than 100 degrees Increased swelling, pain, or redness at injection site. Increased back and/or leg pain New numbness or change in symptoms from before the procedure.    Thank you for visiting [facility].   May resume aspirin immediately after procedure! 

## 2022-06-29 ENCOUNTER — Ambulatory Visit
Admission: RE | Admit: 2022-06-29 | Discharge: 2022-06-29 | Disposition: A | Discharge status: Home / Self Care | Payer: Medicare Other | Source: Ambulatory Visit | Attending: Internal Medicine | Admitting: Internal Medicine

## 2022-06-29 ENCOUNTER — Other Ambulatory Visit: Payer: Self-pay | Admitting: Internal Medicine

## 2022-06-29 DIAGNOSIS — S32050A Wedge compression fracture of fifth lumbar vertebra, initial encounter for closed fracture: Secondary | ICD-10-CM

## 2022-06-29 DIAGNOSIS — M8000XA Age-related osteoporosis with current pathological fracture, unspecified site, initial encounter for fracture: Secondary | ICD-10-CM

## 2022-06-29 HISTORY — PX: IR KYPHO EA ADDL LEVEL THORACIC OR LUMBAR: IMG5520

## 2022-06-29 HISTORY — PX: IR KYPHO LUMBAR INC FX REDUCE BONE BX UNI/BIL CANNULATION INC/IMAGING: IMG5519

## 2022-06-29 MED ORDER — MIDAZOLAM HCL 2 MG/2ML IJ SOLN
1.0000 mg | INTRAMUSCULAR | Status: DC | PRN
  Administered 2022-06-29 (×2): 1 mg via INTRAVENOUS
  Administered 2022-06-29 (×2): 0.5 mg via INTRAVENOUS
  Administered 2022-06-29: 1 mg via INTRAVENOUS

## 2022-06-29 MED ORDER — ACETAMINOPHEN 10 MG/ML IV SOLN: 1000.0000 mg | Freq: Once | INTRAVENOUS | Status: DC

## 2022-06-29 MED ORDER — SODIUM CHLORIDE 0.9 % IV SOLN: INTRAVENOUS | Status: DC

## 2022-06-29 MED ORDER — VANCOMYCIN HCL IN DEXTROSE 1-5 GM/200ML-% IV SOLN
1000.0000 mg | INTRAVENOUS | Status: AC
  Administered 2022-06-29: 1000 mg via INTRAVENOUS

## 2022-06-29 MED ORDER — FENTANYL CITRATE PF 50 MCG/ML IJ SOSY
25.0000 ug | PREFILLED_SYRINGE | INTRAMUSCULAR | Status: DC | PRN
  Administered 2022-06-29 (×4): 25 ug via INTRAVENOUS

## 2022-06-29 NOTE — Progress Notes (Signed)
Pt back in nursing recovery area. Pt still drowsy from procedure but will wake up when spoken to. Pt follows commands, talks in complete sentences and has no complaints at this time. Pt will remain in nursing station until discharge.  ?

## 2022-07-06 ENCOUNTER — Telehealth: Payer: Self-pay

## 2022-07-06 ENCOUNTER — Other Ambulatory Visit: Payer: Self-pay | Admitting: Interventional Radiology

## 2022-07-06 DIAGNOSIS — S32050G Wedge compression fracture of fifth lumbar vertebra, subsequent encounter for fracture with delayed healing: Secondary | ICD-10-CM

## 2022-07-06 NOTE — Telephone Encounter (Signed)
Phone call with patients sister to follow up from her kyphoplasty on 06/29/22. Pt reports her pain is completely gone post procedure but is still having "a lot of soreness". Pt reports she is able to move around a little better. Pt denies any signs of infection, redness at the site, draining or fever. Pt has no complaints at this time and will be scheduled for a telephone follow up with Dr. Laurence Ferrari next week. Pt advised to call back if anything were to change or any concerns arise and we will arrange an in person appointment. Pt verbalized understanding.

## 2022-07-18 ENCOUNTER — Ambulatory Visit
Admission: RE | Admit: 2022-07-18 | Discharge: 2022-07-18 | Disposition: A | Discharge status: Home / Self Care | Payer: Medicare Other | Source: Ambulatory Visit | Attending: Interventional Radiology | Admitting: Interventional Radiology

## 2022-07-18 DIAGNOSIS — S32050G Wedge compression fracture of fifth lumbar vertebra, subsequent encounter for fracture with delayed healing: Secondary | ICD-10-CM

## 2022-07-18 NOTE — Progress Notes (Signed)
Chief Complaint: Patient was consulted remotely today (TeleHealth) for low back pain at the request of Jatavis Malek K.    Referring Physician(s): Maleigha Colvard K  History of Present Illness: Brandy Oliver is a 82 y.o. female presented to VIR clinic at [facility] on 06/22/22 for evaluation of back pain, compression fracture at L3 and L5, and candidacy for possible vertebral augmentation.    She was evaluated by my partner, Dr. Ruthy Dick and found to be a candidate for two-level vertebral augmentation with balloon kyphoplasty at L3 and L5.  Her procedure was performed successfully by me on 06/29/2022.  We spoke over the phone today for her 2-week follow-up evaluation.  Unfortunately, Brandy Oliver continues to have unrelenting back pain.  Her pain is the same character and severity as it was prior to the procedure but seems to be more constant.  She is dismayed that she did not experience better relief following her cement augmentation procedure.  Past Medical History:  Diagnosis Date   Actinic keratosis    Cancer (HCC)    skin    Cervical disc disease    Colon adenomas    Coronary artery disease    Diabetes mellitus without complication (HCC)    Diarrhea    Dysphagia    GERD (gastroesophageal reflux disease)    Hyperlipidemia    Hypertension    IDA (iron deficiency anemia)    Meningioma (HCC)    Neuropathy    NSVT (nonsustained ventricular tachycardia) (HCC)    PUD (peptic ulcer disease)    Sleep apnea    Squamous cell carcinoma of skin 11/30/2016   Right lat. calf. SCCis. EDC.   Squamous cell carcinoma of skin 12/27/2017   Right dorsum wrist. WD SCC. EDC.   Squamous cell carcinoma of skin 10/30/2019   Left lat chin. SCCis    Past Surgical History:  Procedure Laterality Date   ABDOMINAL HYSTERECTOMY     COLONOSCOPY     COLONOSCOPY WITH PROPOFOL N/A 06/10/2018   Procedure: COLONOSCOPY WITH PROPOFOL;  Surgeon: Scot Jun, MD;  Location: [facility]  ENDOSCOPY;  Service: Endoscopy;  Laterality: N/A;   ESOPHAGOGASTRODUODENOSCOPY     ESOPHAGOGASTRODUODENOSCOPY N/A 06/10/2018   Procedure: ESOPHAGOGASTRODUODENOSCOPY (EGD);  Surgeon: Scot Jun, MD;  Location: [facility] ENDOSCOPY;  Service: Endoscopy;  Laterality: N/A;   IR KYPHO EA ADDL LEVEL THORACIC OR LUMBAR  06/29/2022   IR KYPHO LUMBAR INC FX REDUCE BONE BX UNI/BIL CANNULATION INC/IMAGING  06/29/2022   IR RADIOLOGIST EVAL & MGMT  06/22/2022    Allergies: Duloxetine and Penicillins  Medications: Prior to Admission medications   Medication Sig Start Date End Date Taking? Authorizing Provider  alendronate (FOSAMAX) 70 MG tablet Take 1 tablet (70 mg total) by mouth every 7 (seven) days Take with a full glass of water. Do not lie down for the next 30 min. 05/12/22 05/12/23  [provider]  aspirin EC 81 MG tablet Take 1 tablet (81 mg total) by mouth daily. Swallow whole. 06/11/22   Wouk, Wilfred Curtis, MD  gabapentin (NEURONTIN) 300 MG capsule Take 600 mg by mouth at bedtime.    [provider]  glipiZIDE (GLUCOTROL XL) 10 MG 24 hr tablet Take 10 mg by mouth daily with breakfast.    [provider]  insulin glargine (LANTUS) 100 UNIT/ML Solostar Pen Inject 10 Units into the skin daily.    [provider]  Iron-Vitamin C (VITRON-C PO) Take by mouth 3 (three) times a week.    [provider]  metFORMIN (GLUCOPHAGE) 500 MG tablet Take by mouth 2 (two) times daily with a meal. Patient not taking: Reported on 06/08/2022    [provider]  ondansetron (ZOFRAN-ODT) 4 MG disintegrating tablet Take 4 mg by mouth every 8 (eight) hours as needed for nausea or vomiting.    [provider]  pravastatin (PRAVACHOL) 40 MG tablet Take 1 tablet (40 mg total) by mouth daily. 06/10/22   Wouk, Wilfred Curtis, MD  traZODone (DESYREL) 50 MG tablet Take 50 mg by mouth at bedtime.    [provider]  vitamin B-12 (CYANOCOBALAMIN) 500 MCG tablet Take 500  mcg by mouth daily.    [provider]     Family History  Problem Relation Age of Onset   Breast cancer Neg Hx     Social History   Socioeconomic History   Marital status: Widowed    Spouse name: Not on file   Number of children: Not on file   Years of education: Not on file   Highest education level: Not on file  Occupational History   Not on file  Tobacco Use   Smoking status: Never   Smokeless tobacco: Never  Vaping Use   Vaping Use: Never used  Substance and Sexual Activity   Alcohol use: Never   Drug use: Never   Sexual activity: Not on file  Other Topics Concern   Not on file  Social History Narrative   Not on file   Social Determinants of Health   Financial Resource Strain: Not on file  Food Insecurity: No Food Insecurity (06/09/2022)   Hunger Vital Sign    Worried About Running Out of Food in the Last Year: Never true    Ran Out of Food in the Last Year: Never true  Transportation Needs: No Transportation Needs (06/09/2022)   PRAPARE - Administrator, Civil Service (Medical): No    Lack of Transportation (Non-Medical): No  Physical Activity: Not on file  Stress: Not on file  Social Connections: Not on file    Review of Systems  Review of Systems: A 12 point ROS discussed and pertinent positives are indicated in the HPI above.  All other systems are negative.  Physical Exam No direct physical exam was performed (except for noted visual exam findings with Video Visits).    Vital Signs: There were no vitals taken for this visit.  Imaging: DG Radiologist Eval And Mgmt  Result Date: 07/18/2022 EXAM: ESTABLISHED PATIENT OFFICE VISIT CHIEF COMPLAINT: SEE EPIC NOTE HISTORY OF PRESENT ILLNESS: SEE EPIC NOTE REVIEW OF SYSTEMS: SEE EPIC NOTE PHYSICAL EXAMINATION: SEE EPIC NOTE ASSESSMENT AND PLAN: SEE EPIC NOTE Electronically Signed   By: Malachy Moan M.D.   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 She presents for cement augmentation with balloon kyphoplasty at both levels. EXAM: FLUOROSCOPIC GUIDED KYPHOPLASTY OF THE L3 VERTEBRAL BODY FLUOROSCOPIC GUIDED KYPHOPLASTY OF THE L5 VERTEBRAL BODY COMPARISON:  None Available. MEDICATIONS: As antibiotic prophylaxis, 1 g vancomycin was ordered pre-procedure and administered intravenously within 1 hour of incision. ANESTHESIA/SEDATION: Moderate (conscious) sedation was employed during this procedure. A total of Versed 4 mg and Fentanyl 100 mcg was administered intravenously. Moderate Sedation Time: 52 minutes. The patient's level of consciousness and vital signs were monitored continuously by radiology nursing throughout the  procedure under my direct supervision. FLUOROSCOPY TIME:  Radiation exposure index: 85.9 mGy reference air kerma COMPLICATIONS: None immediate. PROCEDURE: The procedure, risks (including but not limited to bleeding, infection, organ damage), benefits, and alternatives were explained to the patient. Questions regarding the procedure were encouraged and answered. The patient understands and consents to the procedure. L5 The patient was placed prone on the fluoroscopic table. The skin overlying the upper thoracic region was then prepped and draped in the usual sterile fashion. Maximal barrier sterile technique was utilized including caps, mask, sterile gowns, sterile gloves, sterile drape, hand hygiene and skin antiseptic. Intravenous Fentanyl and Versed were administered as conscious sedation during continuous cardiorespiratory monitoring by the radiology RN. The left pedicle at L5 was then infiltrated with 1% lidocaine followed by the advancement of a Kyphon trocar needle through the left pedicle into the posterior one-third of the vertebral body. Subsequently, the osteo drill was  advanced to the anterior third of the vertebral body. The osteo drill was retracted. Through the working cannula, a Kyphon inflatable bone tamp 15 x 2.5 was advanced and positioned with the distal marker approximately 5 mm from the anterior aspect of the cortex. Appropriate positioning was confirmed on the AP projection. At this time, the balloon was expanded using contrast via a Kyphon inflation syringe device via micro tubing. In similar fashion, the right L5 pedicle was infiltrated with 1% lidocaine followed by the advancement of a second Kyphon trocar needle through the right pedicle into the posterior third of the vertebral body. Subsequently, the osteo drill was coaxially advanced to the anterior right third. The osteo drill was exchanged for a Kyphon inflatable bone tamp 15 x 2.5, advanced to the 5 mm of the anterior aspect of the cortex. The balloon was then expanded using contrast as above. Inflations were continued until there was near apposition with the superior end plate. L3 The left pedicle at L3 was then infiltrated with 1% lidocaine followed by the advancement of a Kyphon trocar needle through the left pedicle into the posterior one-third of the vertebral body. Subsequently, the osteo drill was advanced to the anterior third of the vertebral body. The osteo drill was retracted. Through the working cannula, a Kyphon inflatable bone tamp 15 x 2.5 was advanced and positioned with the distal marker approximately 5 mm from the anterior aspect of the cortex. Appropriate positioning was confirmed on the AP projection. At this time, the balloon was expanded using contrast via a Kyphon inflation syringe device via micro tubing. The balloon was centered within the mid aspect of the vertebral body. Therefore, a bipedicular access was not required. At this time, methylmethacrylate mixture was reconstituted in the Kyphon bone mixing device system. This was then loaded into the delivery mechanism, attached to the  cement delivery system. The balloons were deflated and removed followed by the instillation of methylmethacrylate mixture with excellent filling in the AP and lateral projections. No extravasation was noted in the disk spaces or posteriorly into the spinal canal. No epidural venous contamination was seen. The working cannulae and the bone filler were then retrieved and removed. Hemostasis was achieved with manual compression. The patient tolerated the procedure well without immediate postprocedural complication. IMPRESSION: 1. Technically successful L5 vertebral body augmentation using balloon kyphoplasty. 2. Technically successful L3 vertebral body augmentation using balloon kyphoplasty. 3. Per CMS PQRS reporting requirements (PQRS Measure 24): Given the patient's age of greater than 50 and the fracture site (hip, distal radius, or spine), the patient should be tested  for osteoporosis using DXA, and the appropriate treatment considered based on the DXA results. Electronically Signed   By: Malachy Moan M.D.   On: 06/29/2022 10:49   IR KYPHO EA ADDL LEVEL THORACIC OR LUMBAR  Result Date: 06/29/2022 CLINICAL DATA:  82 year old female with osteoporotic compression fractures of L3 and L5 with high symptomatology. She presents for cement augmentation with balloon kyphoplasty at both levels. EXAM: FLUOROSCOPIC GUIDED KYPHOPLASTY OF THE L3 VERTEBRAL BODY FLUOROSCOPIC GUIDED KYPHOPLASTY OF THE L5 VERTEBRAL BODY COMPARISON:  None Available. MEDICATIONS: As antibiotic prophylaxis, 1 g vancomycin was ordered pre-procedure and administered intravenously within 1 hour of incision. ANESTHESIA/SEDATION: Moderate (conscious) sedation was employed during this procedure. A total of Versed 4 mg and Fentanyl 100 mcg was administered intravenously. Moderate Sedation Time: 52 minutes. The patient's level of consciousness and vital signs were monitored continuously by radiology nursing throughout the procedure under my direct  supervision. FLUOROSCOPY TIME:  Radiation exposure index: 85.9 mGy reference air kerma COMPLICATIONS: None immediate. PROCEDURE: The procedure, risks (including but not limited to bleeding, infection, organ damage), benefits, and alternatives were explained to the patient. Questions regarding the procedure were encouraged and answered. The patient understands and consents to the procedure. L5 The patient was placed prone on the fluoroscopic table. The skin overlying the upper thoracic region was then prepped and draped in the usual sterile fashion. Maximal barrier sterile technique was utilized including caps, mask, sterile gowns, sterile gloves, sterile drape, hand hygiene and skin antiseptic. Intravenous Fentanyl and Versed were administered as conscious sedation during continuous cardiorespiratory monitoring by the radiology RN. The left pedicle at L5 was then infiltrated with 1% lidocaine followed by the advancement of a Kyphon trocar needle through the left pedicle into the posterior one-third of the vertebral body. Subsequently, the osteo drill was advanced to the anterior third of the vertebral body. The osteo drill was retracted. Through the working cannula, a Kyphon inflatable bone tamp 15 x 2.5 was advanced and positioned with the distal marker approximately 5 mm from the anterior aspect of the cortex. Appropriate positioning was confirmed on the AP projection. At this time, the balloon was expanded using contrast via a Kyphon inflation syringe device via micro tubing. In similar fashion, the right L5 pedicle was infiltrated with 1% lidocaine followed by the advancement of a second Kyphon trocar needle through the right pedicle into the posterior third of the vertebral body. Subsequently, the osteo drill was coaxially advanced to the anterior right third. The osteo drill was exchanged for a Kyphon inflatable bone tamp 15 x 2.5, advanced to the 5 mm of the anterior aspect of the cortex. The balloon was then  expanded using contrast as above. Inflations were continued until there was near apposition with the superior end plate. L3 The left pedicle at L3 was then infiltrated with 1% lidocaine followed by the advancement of a Kyphon trocar needle through the left pedicle into the posterior one-third of the vertebral body. Subsequently, the osteo drill was advanced to the anterior third of the vertebral body. The osteo drill was retracted. Through the working cannula, a Kyphon inflatable bone tamp 15 x 2.5 was advanced and positioned with the distal marker approximately 5 mm from the anterior aspect of the cortex. Appropriate positioning was confirmed on the AP projection. At this time, the balloon was expanded using contrast via a Kyphon inflation syringe device via micro tubing. The balloon was centered within the mid aspect of the vertebral body. Therefore, a bipedicular access was not  required. At this time, methylmethacrylate mixture was reconstituted in the Kyphon bone mixing device system. This was then loaded into the delivery mechanism, attached to the cement delivery system. The balloons were deflated and removed followed by the instillation of methylmethacrylate mixture with excellent filling in the AP and lateral projections. No extravasation was noted in the disk spaces or posteriorly into the spinal canal. No epidural venous contamination was seen. The working cannulae and the bone filler were then retrieved and removed. Hemostasis was achieved with manual compression. The patient tolerated the procedure well without immediate postprocedural complication. IMPRESSION: 1. Technically successful L5 vertebral body augmentation using balloon kyphoplasty. 2. Technically successful L3 vertebral body augmentation using balloon kyphoplasty. 3. Per CMS PQRS reporting requirements (PQRS Measure 24): Given the patient's age of greater than 50 and the fracture site (hip, distal radius, or spine), the patient should be  tested for osteoporosis using DXA, and the appropriate treatment considered based on the DXA results. Electronically Signed   By: Malachy Moan M.D.   On: 06/29/2022 10:49   IR Radiologist Eval & Mgmt  Result Date: 06/22/2022 EXAM: NEW PATIENT OFFICE VISIT CHIEF COMPLAINT: Electronic medical record HISTORY OF PRESENT ILLNESS: Electronic medical record REVIEW OF SYSTEMS: Electronic medical record PHYSICAL EXAMINATION: Electronic medical record ASSESSMENT AND PLAN: Electronic medical record Electronically Signed   By: Gilmer Mor D.O.   On: 06/22/2022 16:43   MR LUMBAR SPINE WO CONTRAST  Result Date: 06/19/2022 CLINICAL DATA:  Low back pain. Fall 4-5 weeks ago. L3 compression fracture. EXAM: MRI LUMBAR SPINE WITHOUT CONTRAST TECHNIQUE: Multiplanar, multisequence MR imaging of the lumbar spine was performed. No intravenous contrast was administered. COMPARISON:  Lumbar spine MRI 09/06/2021. CT abdomen and pelvis 06/08/2022. FINDINGS: Segmentation: Transitional lumbosacral anatomy with largely sacralized L5. Hypoplastic L5-S1 disc. Alignment: Mild lumbar levoscoliosis. Grade 1 anterolisthesis of L2 on L3 and L3 on L4,, with the former having at most minimally increased from the prior MRI. Vertebrae: L3 superior endplate compression fracture with 20% height loss, slightly progressed from the recent CT and with associated moderate marrow edema. No retropulsion. Right-sided L5 superior endplate fracture with mild marrow edema and only at most subtle vertebral body height loss. No suspicious marrow lesion. Conus medullaris and cauda equina: Conus extends to the L1 level. Conus and cauda equina appear normal. Paraspinal and other soft tissues: Asymmetric left renal atrophy. Suggestion of mild chronic urothelial thickening of the left renal pelvis. Disc levels: Disc desiccation throughout the lumbar spine. Moderate disc space narrowing at L1-2, L3-4, and L4-5 and mild narrowing at L2-3 and T11-12. T11-12: Mild  disc bulging and mild facet hypertrophy without stenosis, unchanged from the prior MRI. T12-L1: Mild right and moderate left facet hypertrophy without disc herniation or stenosis, unchanged. L1-2: Left eccentric disc bulging and moderate facet and ligamentum flavum hypertrophy result in borderline spinal stenosis and mild left neural foraminal stenosis, unchanged. L2-3: Anterolisthesis with right eccentric bulging of uncovered disc, a right foraminal disc protrusion, and severe facet and ligamentum flavum hypertrophy result in mild-to-moderate spinal stenosis, mild right lateral recess stenosis, and moderate right neural foraminal stenosis, mildly progressed. L3-4: Anterolisthesis with right eccentric bulging of uncovered disc, a right foraminal disc protrusion, and severe facet and ligamentum flavum hypertrophy result in mild-to-moderate spinal stenosis, mild bilateral lateral recess stenosis, and moderate to severe right neural foraminal stenosis, stable to slightly progressed. L4-5: Disc bulging eccentric to the left and moderate right and severe left facet and ligamentum flavum hypertrophy result in mild left  neural foraminal stenosis without spinal stenosis, unchanged. L5-S1: Transitional anatomy with hypoplastic disc.  No stenosis. IMPRESSION: 1. Acute or subacute L3 compression fracture with 20% height loss. 2. Acute or subacute right-sided L5 superior endplate fracture without significant height loss. 3. Mild-to-moderate spinal stenosis and moderate right neural foraminal stenosis at L2-3, mildly progressed. 4. Mild-to-moderate spinal stenosis and moderate to severe right neural foraminal stenosis at L3-4, stable to slightly progressed. Electronically Signed   By: Sebastian Ache M.D.   On: 06/19/2022 08:50    Labs:  CBC: Recent Labs    06/08/22 1737 06/09/22 0515  WBC 9.6 7.8  HGB 14.8 13.1  HCT 40.7 37.0  PLT 222 206    COAGS: Recent Labs    06/08/22 1737  INR 1.0  APTT 24     BMP: Recent Labs    06/08/22 1737 06/09/22 0515 06/10/22 0504  NA 128* 131* 135  K 2.7* 3.1* 4.0  CL 89* 97* 102  CO2 20* 26 26  GLUCOSE 294* 223* 137*  BUN 14 10 12   CALCIUM 9.7 8.7* 8.9  CREATININE 0.84 0.78 0.94  GFRNONAA >60 >60 >60    LIVER FUNCTION TESTS: Recent Labs    06/08/22 1737  BILITOT 1.0  AST 21  ALT 17  ALKPHOS 80  PROT 6.7  ALBUMIN 3.9    TUMOR MARKERS: No results for input(s): "AFPTM", "CEA", "CA199", "CHROMGRNA" in the last 8760 hours.  Assessment and Plan:  Pleasant 82 year old female now 2 weeks status post kyphoplasty at L3 and L5.  Unfortunately, her symptoms remain unchanged.  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  It is possible that the spinal stenosis is the primary cause of her persistent back pain.  She may benefit from epidural steroid injection.  Her referring physician may wish to send her for epidural steroid injection.  We do perform those procedures here at [facility] as well and would be happy to assist with L3-L4 ESI.     Electronically Signed: Sterling Big 07/18/2022, 2:53 PM   I spent a total of 15 Minutes in remote  clinical consultation, greater than 50% of which was counseling/coordinating care for L3 and L5 compression fractures.    Visit type: Audio only (telephone). Audio (no video) only due to patient preference. Alternative for in-person consultation at [facility], [address] [place], [place], [place]. This visit type was conducted due to national recommendations for restrictions regarding the COVID-19 Pandemic (e.g. social distancing).  This format is felt to be most appropriate for this patient at this time.  All issues noted in this document were discussed and addressed.

## 2022-08-15 ENCOUNTER — Other Ambulatory Visit: Payer: Self-pay | Admitting: Obstetrics and Gynecology

## 2023-06-06 ENCOUNTER — Ambulatory Visit: Payer: Medicare Other | Admitting: Dermatology

## 2023-06-12 ENCOUNTER — Encounter: Payer: Self-pay | Admitting: Dermatology

## 2023-06-12 ENCOUNTER — Ambulatory Visit: Payer: Medicare Other | Admitting: Dermatology

## 2023-06-12 DIAGNOSIS — C44712 Basal cell carcinoma of skin of right lower limb, including hip: Secondary | ICD-10-CM

## 2023-06-12 DIAGNOSIS — D492 Neoplasm of unspecified behavior of bone, soft tissue, and skin: Secondary | ICD-10-CM | POA: Diagnosis not present

## 2023-06-12 DIAGNOSIS — L820 Inflamed seborrheic keratosis: Secondary | ICD-10-CM | POA: Diagnosis not present

## 2023-06-12 DIAGNOSIS — L578 Other skin changes due to chronic exposure to nonionizing radiation: Secondary | ICD-10-CM

## 2023-06-12 DIAGNOSIS — Z8589 Personal history of malignant neoplasm of other organs and systems: Secondary | ICD-10-CM

## 2023-06-12 DIAGNOSIS — W908XXA Exposure to other nonionizing radiation, initial encounter: Secondary | ICD-10-CM | POA: Diagnosis not present

## 2023-06-12 DIAGNOSIS — D1801 Hemangioma of skin and subcutaneous tissue: Secondary | ICD-10-CM

## 2023-06-12 DIAGNOSIS — Z85828 Personal history of other malignant neoplasm of skin: Secondary | ICD-10-CM

## 2023-06-12 DIAGNOSIS — L821 Other seborrheic keratosis: Secondary | ICD-10-CM

## 2023-06-12 DIAGNOSIS — C4491 Basal cell carcinoma of skin, unspecified: Secondary | ICD-10-CM

## 2023-06-12 DIAGNOSIS — L814 Other melanin hyperpigmentation: Secondary | ICD-10-CM

## 2023-06-12 DIAGNOSIS — L570 Actinic keratosis: Secondary | ICD-10-CM | POA: Diagnosis not present

## 2023-06-12 DIAGNOSIS — D489 Neoplasm of uncertain behavior, unspecified: Secondary | ICD-10-CM

## 2023-06-12 DIAGNOSIS — D229 Melanocytic nevi, unspecified: Secondary | ICD-10-CM

## 2023-06-12 DIAGNOSIS — D692 Other nonthrombocytopenic purpura: Secondary | ICD-10-CM

## 2023-06-12 DIAGNOSIS — Z1283 Encounter for screening for malignant neoplasm of skin: Secondary | ICD-10-CM | POA: Diagnosis not present

## 2023-06-12 HISTORY — DX: Basal cell carcinoma of skin, unspecified: C44.91

## 2023-06-12 NOTE — Progress Notes (Signed)
 Follow-Up Visit   Subjective  Brandy Oliver is a 83 y.o. female who presents for the following: Skin Cancer Screening and Full Body Skin Exam hx of sccis, hx of scc, hx of isks, hx of aks. Patient reports spots at face , ears, and right shoulder and a pink scaly spot at right ankle.   The patient presents for Total-Body Skin Exam (TBSE) for skin cancer screening and mole check. The patient has spots, moles and lesions to be evaluated, some may be new or changing and the patient may have concern these could be cancer.  The following portions of the chart were reviewed this encounter and updated as appropriate: medications, allergies, medical history  Review of Systems:  No other skin or systemic complaints except as noted in HPI or Assessment and Plan.  Objective  Well appearing patient in no apparent distress; mood and affect are within normal limits.  A full examination was performed including scalp, head, eyes, ears, nose, lips, neck, chest, axillae, abdomen, back, buttocks, bilateral upper extremities, bilateral lower extremities, hands, feet, fingers, toes, fingernails, and toenails. All findings within normal limits unless otherwise noted below.   Relevant physical exam findings are noted in the Assessment and Plan.  face and ears x 22 (22) Erythematous thin papules/macules with gritty scale.  b/l arms and hands x 5, right shoulder x 1, right and left inner thigh x 16 (22) Erythematous stuck-on, waxy papule or plaque right medial ankle 3.1 x 2 cm pink crusted patch    Assessment & Plan   SKIN CANCER SCREENING PERFORMED TODAY.  ACTINIC DAMAGE - Chronic condition, secondary to cumulative UV/sun exposure - diffuse scaly erythematous macules with underlying dyspigmentation - Recommend daily broad spectrum sunscreen SPF 30+ to sun-exposed areas, reapply every 2 hours as needed.  - Staying in the shade or wearing long sleeves, sun glasses (UVA+UVB protection) and wide brim hats  (4-inch brim around the entire circumference of the hat) are also recommended for sun protection.  - Call for new or changing lesions.  LENTIGINES, SEBORRHEIC KERATOSES, HEMANGIOMAS - Benign normal skin lesions - Benign-appearing - Call for any changes  MELANOCYTIC NEVI - Tan-brown and/or pink-flesh-colored symmetric macules and papules - Benign appearing on exam today - Observation - Call clinic for new or changing moles - Recommend daily use of broad spectrum spf 30+ sunscreen to sun-exposed areas.   Purpura - Chronic; persistent and recurrent.  Treatable, but not curable. 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  - Call if any new or changing lesions are noted between office visits  HISTORY OF SKIN  CANCER Left upper lip patient reports  - Clear. Observe for recurrence.  - Call clinic for new or changing lesions.   - Recommend regular skin exams, daily broad-spectrum spf 30+ sunscreen use, and photoprotection.     ACTINIC KERATOSIS (22) face and ears x 22 (22) Actinic keratoses are  precancerous spots that appear secondary to cumulative UV radiation exposure/sun exposure over time. They are chronic with expected duration over 1 year. A portion of actinic keratoses will progress to squamous cell carcinoma of the skin. It is not possible to reliably predict which spots will progress to skin cancer and so treatment is recommended to prevent development of skin cancer.  Recommend daily broad spectrum sunscreen SPF 30+ to sun-exposed areas, reapply every 2 hours as needed.  Recommend staying in the shade or wearing long sleeves, sun glasses (UVA+UVB protection) and wide brim hats (4-inch brim around the entire circumference of the hat). Call for new or changing lesions. Destruction of lesion - face and ears x 22 (22) Complexity: simple   Destruction method: cryotherapy   Informed consent: discussed and consent obtained   Timeout:  patient name, date of birth, surgical site, and procedure verified Lesion destroyed using liquid nitrogen: Yes   Region frozen until ice ball extended beyond lesion: Yes   Outcome: patient tolerated procedure well with no complications   Post-procedure details: wound care instructions given   INFLAMED SEBORRHEIC KERATOSIS (22) b/l arms and hands x 5, right shoulder x 1, right and left inner thigh x 16 (22) Symptomatic, irritating, patient would like treated. Destruction of lesion - b/l arms and hands x 5, right shoulder x 1, right and left inner thigh x 16 (22) Complexity: simple   Destruction method: cryotherapy   Informed consent: discussed and consent obtained   Timeout:  patient name, date of birth, surgical site, and procedure verified Lesion destroyed using liquid nitrogen: Yes   Region frozen until ice ball extended beyond lesion: Yes   Outcome: patient tolerated procedure well with no complications   Post-procedure details: wound care instructions given   NEOPLASM OF UNCERTAIN BEHAVIOR right medial ankle Skin / nail biopsy Type of  biopsy: tangential   Informed consent: discussed and consent obtained   Patient was prepped and draped in usual sterile fashion: Area prepped with alcohol. Anesthesia: the lesion was anesthetized in a standard fashion   Anesthetic:  1% lidocaine w/ epinephrine 1-100,000 buffered w/ 8.4% NaHCO3 Instrument used: flexible razor blade   Hemostasis achieved with: pressure, aluminum chloride and electrodesiccation   Outcome: patient tolerated procedure well   Post-procedure details: wound care instructions given   Post-procedure details comment:  Ointment and small bandage applied Specimen 1 - Surgical pathology Differential Diagnosis: r/o scc   Check Margins: No R/o scc  Return in about 1 year (around 06/11/2024) for TBSE.  IAsher Muir, CMA, am acting as scribe for Armida Sans, MD.   Documentation: I have reviewed the above documentation for accuracy and completeness, and I agree with the above.  Armida Sans, MD

## 2023-06-12 NOTE — Patient Instructions (Addendum)
 Biopsy Wound Care Instructions  Leave the original bandage on for 24 hours if possible.  If the bandage becomes soaked or soiled before that time, it is OK to remove it and examine the wound.  A small amount of post-operative bleeding is normal.  If excessive bleeding occurs, remove the bandage, place gauze over the site and apply continuous pressure (no peeking) over the area for 30 minutes. If this does not work, please call our clinic as soon as possible or page your doctor if it is after hours.   Once a day, cleanse the wound with soap and water. It is fine to shower. If a thick crust develops you may use a Q-tip dipped into dilute hydrogen peroxide (mix 1:1 with water) to dissolve it.  Hydrogen peroxide can slow the healing process, so use it only as needed.    After washing, apply petroleum jelly (Vaseline) or an antibiotic ointment if your doctor prescribed one for you, followed by a bandage.    For best healing, the wound should be covered with a layer of ointment at all times. If you are not able to keep the area covered with a bandage to hold the ointment in place, this may mean re-applying the ointment several times a day.  Continue this wound care until the wound has healed and is no longer open.   Itching and mild discomfort is normal during the healing process. However, if you develop pain or severe itching, please call our office.   If you have any discomfort, you can take Tylenol (acetaminophen) or ibuprofen as directed on the bottle. (Please do not take these if you have an allergy to them or cannot take them for another reason).  Some redness, tenderness and white or yellow material in the wound is normal healing.  If the area becomes very sore and red, or develops a thick yellow-green material (pus), it may be infected; please notify us.    If you have stitches, return to clinic as directed to have the stitches removed. You will continue wound care for 2-3 days after the stitches  are removed.   Wound healing continues for up to one year following surgery. It is not unusual to experience pain in the scar from time to time during the interval.  If the pain becomes severe or the scar thickens, you should notify the office.    A slight amount of redness in a scar is expected for the first six months.  After six months, the redness will fade and the scar will soften and fade.  The color difference becomes less noticeable with time.  If there are any problems, return for a post-op surgery check at your earliest convenience.  To improve the appearance of the scar, you can use silicone scar gel, cream, or sheets (such as Mederma or Serica) every night for up to one year. These are available over the counter (without a prescription).  Please call our office at [phone] for any questions or concerns.      Seborrheic Keratosis  What causes seborrheic keratoses? Seborrheic keratoses are harmless, common skin growths that first appear during adult life.  As time goes by, more growths appear.  Some people may develop a large number of them.  Seborrheic keratoses appear on both covered and uncovered body parts.  They are not caused by sunlight.  The tendency to develop seborrheic keratoses can be inherited.  They vary in color from skin-colored to gray, brown, or even black.  They can be either smooth or have a rough, warty surface.   Seborrheic keratoses are superficial and look as if they were stuck on the skin.  Under the microscope this type of keratosis looks like layers upon layers of skin.  That is why at times the top layer may seem to fall off, but the rest of the growth remains and re-grows.    Treatment Seborrheic keratoses do not need to be treated, but can easily be removed in the office.  Seborrheic keratoses often cause symptoms when they rub on clothing or jewelry.  Lesions can be in the way of shaving.  If they become inflamed, they can cause itching, soreness, or  burning.  Removal of a seborrheic keratosis can be accomplished by freezing, burning, or surgery. If any spot bleeds, scabs, or grows rapidly, please return to have it checked, as these can be an indication of a skin cancer.   Cryotherapy Aftercare  Wash gently with soap and water everyday.   Apply Vaseline and Band-Aid daily until healed.   Actinic keratoses are precancerous spots that appear secondary to cumulative UV radiation exposure/sun exposure over time. They are chronic with expected duration over 1 year. A portion of actinic keratoses will progress to squamous cell carcinoma of the skin. It is not possible to reliably predict which spots will progress to skin cancer and so treatment is recommended to prevent development of skin cancer.  Recommend daily broad spectrum sunscreen SPF 30+ to sun-exposed areas, reapply every 2 hours as needed.  Recommend staying in the shade or wearing long sleeves, sun glasses (UVA+UVB protection) and wide brim hats (4-inch brim around the entire circumference of the hat). Call for new or changing lesions.     Melanoma ABCDEs  Melanoma is the most dangerous type of skin cancer, and is the leading cause of death from skin disease.  You are more likely to develop melanoma if you: Have light-colored skin, light-colored eyes, or red or blond hair Spend a lot of time in the sun Tan regularly, either outdoors or in a tanning bed Have had blistering sunburns, especially during childhood Have a close family member who has had a melanoma Have atypical moles or large birthmarks  Early detection of melanoma is key since treatment is typically straightforward and cure rates are extremely high if we catch it early.   The first sign of melanoma is often a change in a mole or a new dark spot.  The ABCDE system is a way of remembering the signs of melanoma.  A for asymmetry:  The two halves do not match. B for border:  The edges of the growth are irregular. C  for color:  A mixture of colors are present instead of an even brown color. D for diameter:  Melanomas are usually (but not always) greater than 6mm - the size of a pencil eraser. E for evolution:  The spot keeps changing in size, shape, and color.  Please check your skin once per month between visits. You can use a small mirror in front and a large mirror behind you to keep an eye on the back side or your body.   If you see any new or changing lesions before your next follow-up, please call to schedule a visit.  Please continue daily skin protection including broad spectrum sunscreen SPF 30+ to sun-exposed areas, reapplying every 2 hours as needed when you're outdoors.   Staying in the shade or wearing long sleeves, sun glasses (UVA+UVB  protection) and wide brim hats (4-inch brim around the entire circumference of the hat) are also recommended for sun protection.     Due to recent changes in healthcare laws, you may see results of your pathology and/or laboratory studies on MyChart before the doctors have had a chance to review them. We understand that in some cases there may be results that are confusing or concerning to you. Please understand that not all results are received at the same time and often the doctors may need to interpret multiple results in order to provide you with the best plan of care or course of treatment. Therefore, we ask that you please give [place] 2 business days to thoroughly review all your results before contacting the office for clarification. Should we see a critical lab result, you will be contacted sooner.   If You Need Anything After Your Visit  If you have any questions or concerns for your doctor, please call our main line at [phone] and press option 4 to reach your doctor's medical assistant. If no one answers, please leave a voicemail as directed and we will return your call as soon as possible. Messages left after 4 pm will be answered the following business  day.   You may also send [place] a message via MyChart. We typically respond to MyChart messages within 1-2 business days.  For prescription refills, please ask your pharmacy to contact our office. Our fax number is [phone].  If you have an urgent issue when the clinic is closed that cannot wait until the next business day, you can page your doctor at the number below.    Please note that while we do our best to be available for urgent issues outside of office hours, we are not available 24/7.   If you have an urgent issue and are unable to reach [place], you may choose to seek medical care at your doctor's office, retail clinic, urgent care center, or emergency room.  If you have a medical emergency, please immediately call 911 or go to the emergency department.  Pager Numbers  - Dr. Gwen Pounds: [phone]  - Dr. Roseanne Reno: [phone]  - Dr. Katrinka Blazing: [phone]   In the event of inclement weather, please call our main line at [phone] for an update on the status of any delays or closures.  Dermatology Medication Tips: Please keep the boxes that topical medications come in in order to help keep track of the instructions about where and how to use these. Pharmacies typically print the medication instructions only on the boxes and not directly on the medication tubes.   If your medication is too expensive, please contact our office at [phone] option 4 or send [place] a message through MyChart.   We are unable to tell what your co-pay for medications will be in advance as this is different depending on your insurance coverage. However, we may be able to find a substitute medication at lower cost or fill out paperwork to get insurance to cover a needed medication.   If a prior authorization is required to get your medication covered by your insurance company, please allow [place] 1-2 business days to complete this process.  Drug prices often vary depending on where the prescription is filled  and some pharmacies may offer cheaper prices.  The website www.goodrx.com contains coupons for medications through different pharmacies. The prices here do not account for what the cost may be with help from insurance (it may be cheaper with your insurance), but  the website can give you the price if you did not use any insurance.  - You can print the associated coupon and take it with your prescription to the pharmacy.  - You may also stop by our office during regular business hours and pick up a GoodRx coupon card.  - If you need your prescription sent electronically to a different pharmacy, notify our office through [facility] or by phone at [phone] option 4.     Si Usted Necesita Algo Despus de Su Visita  Tambin puede enviarnos un mensaje a travs de Clinical cytogeneticist. Por lo general respondemos a los mensajes de MyChart en el transcurso de 1 a 2 das hbiles.  Para renovar recetas, por favor pida a su farmacia que se ponga en contacto con nuestra oficina. Annie Sable de fax es [place] [phone].  Si tiene un asunto urgente cuando la clnica est cerrada y que no puede esperar hasta el siguiente da hbil, puede llamar/localizar a su doctor(a) al nmero que aparece a continuacin.   Por favor, tenga en cuenta que aunque hacemos todo lo posible para estar disponibles para asuntos urgentes fuera del horario de [place], no estamos disponibles las 24 horas del da, los 7 [address]  [address] de la [place].   Si tiene un problema urgente y no puede comunicarse con nosotros, puede optar por buscar atencin mdica  en el consultorio de su doctor(a), en una clnica privada, en un centro de atencin urgente o en una sala de emergencias.  Si tiene Engineer, drilling, por favor llame inmediatamente al 911 o vaya a la sala de emergencias.  Nmeros de bper  - Dr. Gwen Pounds: [phone]  - Dra. Roseanne Reno: [phone]  - Dr. Katrinka Blazing: [phone]   En caso de inclemencias del tiempo, por favor llame a  Lacy Duverney principal al [phone] para una actualizacin sobre el [place] de cualquier retraso o cierre.  Consejos para la medicacin en dermatologa: Por favor, guarde las cajas en las que vienen los medicamentos de uso tpico para ayudarle a seguir las instrucciones sobre dnde y cmo usarlos. Las farmacias generalmente imprimen las instrucciones del medicamento slo en las cajas y no directamente en los tubos del [place].   Si su medicamento es muy caro, por favor, pngase en contacto con Rolm [place]ala llamando al [phone] y presione la opcin 4 o envenos un mensaje a travs de Clinical cytogeneticist.   No podemos decirle cul ser su copago por los medicamentos por adelantado ya que esto es diferente dependiendo de la cobertura de su seguro. Sin embargo, es posible que podamos encontrar un medicamento sustituto a Audiological scientist un formulario para que el seguro cubra el medicamento que se considera necesario.   Si se requiere una autorizacin previa para que su compaa de seguros [place] su medicamento, por favor permtanos de 1 a 2 das hbiles para completar [address]  Los precios de los medicamentos varan con frecuencia dependiendo del Environmental consultant de dnde se surte la receta y alguna farmacias pueden ofrecer precios ms baratos.  El sitio web www.goodrx.com tiene cupones para medicamentos de Health and safety inspector. Los precios aqu no tienen en cuenta lo que podra costar con la ayuda del seguro (puede ser ms barato con su seguro), pero el sitio web puede darle el precio si no utiliz Tourist information centre manager.  - Puede imprimir el cupn correspondiente y llevarlo con su receta a la farmacia.  - Tambin puede pasar por nuestra oficina durante el horario de atencin regular y Education officer, museum una tarjeta de cupones de  GoodRx.  - Si necesita que su receta se enve electrnicamente a una farmacia diferente, informe a nuestra oficina a travs de MyChart de [place] o por telfono llamando al [phone] y  presione la opcin 4.

## 2023-06-14 ENCOUNTER — Encounter: Payer: Self-pay | Admitting: Dermatology

## 2023-06-14 ENCOUNTER — Telehealth: Payer: Self-pay

## 2023-06-14 NOTE — Telephone Encounter (Addendum)
 Tried calling patient about results. No answer. LM for patient to return call.    ----- Message from Armida Sans sent at 06/14/2023  9:27 AM EST ----- FINAL DIAGNOSIS        1. Skin, right medial ankle :       SUPERFICIAL BASAL CELL CARCINOMA   Cancer = BCC Superficial Large spot Schedule for treatment (EDC vs LN2 followed by topical Chemotherapy with 5-FU)

## 2023-06-18 ENCOUNTER — Telehealth: Payer: Self-pay

## 2023-06-18 NOTE — Telephone Encounter (Addendum)
 Tried calling patient regarding results. No answer. LM for someone to return call.   ----- Message from Armida Sans sent at 06/14/2023  9:27 AM EST ----- FINAL DIAGNOSIS        1. Skin, right medial ankle :       SUPERFICIAL BASAL CELL CARCINOMA   Cancer = BCC Superficial Large spot Schedule for treatment (EDC vs LN2 followed by topical Chemotherapy with 5-FU)

## 2023-06-18 NOTE — Telephone Encounter (Addendum)
 Called and spoke with patient regarding bx results and treatment options. Patient scheduled for follow up to treatment and Dr. Gwen Pounds will discuss treatment options at appointment.   ----- Message from Armida Sans sent at 06/14/2023  9:27 AM EST ----- FINAL DIAGNOSIS        1. Skin, right medial ankle :       SUPERFICIAL BASAL CELL CARCINOMA   Cancer = BCC Superficial Large spot Schedule for treatment (EDC vs LN2 followed by topical Chemotherapy with 5-FU)

## 2023-07-23 ENCOUNTER — Encounter: Payer: Self-pay | Admitting: Dermatology

## 2023-07-23 ENCOUNTER — Ambulatory Visit: Admitting: Dermatology

## 2023-07-23 DIAGNOSIS — L570 Actinic keratosis: Secondary | ICD-10-CM | POA: Diagnosis not present

## 2023-07-23 DIAGNOSIS — C44712 Basal cell carcinoma of skin of right lower limb, including hip: Secondary | ICD-10-CM | POA: Diagnosis not present

## 2023-07-23 DIAGNOSIS — L578 Other skin changes due to chronic exposure to nonionizing radiation: Secondary | ICD-10-CM

## 2023-07-23 DIAGNOSIS — L820 Inflamed seborrheic keratosis: Secondary | ICD-10-CM | POA: Diagnosis not present

## 2023-07-23 DIAGNOSIS — Z7189 Other specified counseling: Secondary | ICD-10-CM

## 2023-07-23 DIAGNOSIS — W908XXA Exposure to other nonionizing radiation, initial encounter: Secondary | ICD-10-CM | POA: Diagnosis not present

## 2023-07-23 DIAGNOSIS — Z5111 Encounter for antineoplastic chemotherapy: Secondary | ICD-10-CM | POA: Diagnosis not present

## 2023-07-23 DIAGNOSIS — Z79899 Other long term (current) drug therapy: Secondary | ICD-10-CM

## 2023-07-23 DIAGNOSIS — L821 Other seborrheic keratosis: Secondary | ICD-10-CM

## 2023-07-23 MED ORDER — CALCIPOTRIENE 0.005 % EX CREA: TOPICAL_CREAM | CUTANEOUS | 0 refills | Status: AC

## 2023-07-23 MED ORDER — FLUOROURACIL 5 % EX CREA: TOPICAL_CREAM | CUTANEOUS | 0 refills | Status: AC

## 2023-07-23 NOTE — Patient Instructions (Addendum)
 STARTING JUNE 1ST APPLY THE FLUOURACIL 5% CREAM THEN THE CALCIPOTRIENE 0.005% CREAM ONE AFTER THE OTHER TO BASAL CELL CARCINOMA SITE ON THE RIGHT MEDIAL ANKLE TWICE DAILY FOR TWO WEEKS. IF PRESCRIPTIONS ARE NOT AFFORDABLE PLEASE CONTACT THE OFFICE SO WE CAN OFFER YOU A DIFFERENT TREATMENT OPTION.    Due to recent changes in healthcare laws, you may see results of your pathology and/or laboratory studies on MyChart before the doctors have had a chance to review them. We understand that in some cases there may be results that are confusing or concerning to you. Please understand that not all results are received at the same time and often the doctors may need to interpret multiple results in order to provide you with the best plan of care or course of treatment. Therefore, we ask that you please give [place] 2 business days to thoroughly review all your results before contacting the office for clarification. Should we see a critical lab result, you will be contacted sooner.   If You Need Anything After Your Visit  If you have any questions or concerns for your doctor, please call our main line at [phone] and press option 4 to reach your doctor's medical assistant. If no one answers, please leave a voicemail as directed and we will return your call as soon as possible. Messages left after 4 pm will be answered the following business day.   You may also send [place] a message via MyChart. We typically respond to MyChart messages within 1-2 business days.  For prescription refills, please ask your pharmacy to contact our office. Our fax number is [phone].  If you have an urgent issue when the clinic is closed that cannot wait until the next business day, you can page your doctor at the number below.    Please note that while we do our best to be available for urgent issues outside of office hours, we are not available 24/7.   If you have an urgent issue and are unable to reach [place], you may choose to  seek medical care at your doctor's office, retail clinic, urgent care center, or emergency room.  If you have a medical emergency, please immediately call 911 or go to the emergency department.  Pager Numbers  - Dr. Gwen Pounds: [phone]  - Dr. Roseanne Reno: [phone]  - Dr. Katrinka Blazing: [phone]   In the event of inclement weather, please call our main line at [phone] for an update on the status of any delays or closures.  Dermatology Medication Tips: Please keep the boxes that topical medications come in in order to help keep track of the instructions about where and how to use these. Pharmacies typically print the medication instructions only on the boxes and not directly on the medication tubes.   If your medication is too expensive, please contact our office at [phone] option 4 or send [place] a message through MyChart.   We are unable to tell what your co-pay for medications will be in advance as this is different depending on your insurance coverage. However, we may be able to find a substitute medication at lower cost or fill out paperwork to get insurance to cover a needed medication.   If a prior authorization is required to get your medication covered by your insurance company, please allow [place] 1-2 business days to complete this process.  Drug prices often vary depending on where the prescription is filled and some pharmacies may offer cheaper prices.  The website www.goodrx.com contains coupons for  medications through different pharmacies. The prices here do not account for what the cost may be with help from insurance (it may be cheaper with your insurance), but the website can give you the price if you did not use any insurance.  - You can print the associated coupon and take it with your prescription to the pharmacy.  - You may also stop by our office during regular business hours and pick up a GoodRx coupon card.  - If you need your prescription sent electronically to a  different pharmacy, notify our office through [facility] or by phone at [phone] option 4.     Si Usted Necesita Algo Despus de Su Visita  Tambin puede enviarnos un mensaje a travs de Clinical cytogeneticist. Por lo general respondemos a los mensajes de MyChart en el transcurso de 1 a 2 das hbiles.  Para renovar recetas, por favor pida a su farmacia que se ponga en contacto con nuestra oficina. Franz Jacks de fax es [place] [phone].  Si tiene un asunto urgente cuando la clnica est cerrada y que no puede esperar hasta el siguiente da hbil, puede llamar/localizar a su doctor(a) al nmero que aparece a continuacin.   Por favor, tenga en cuenta que aunque hacemos todo lo posible para estar disponibles para asuntos urgentes fuera del horario de [place], no estamos disponibles las 24 horas del da, los 7 [address]  [address] de la [place].   Si tiene un problema urgente y no puede comunicarse con nosotros, puede optar por buscar atencin mdica  en el consultorio de su doctor(a), en una clnica privada, en un centro de atencin urgente o en una sala de emergencias.  Si tiene Engineer, drilling, por favor llame inmediatamente al 911 o vaya a la sala de emergencias.  Nmeros de bper  - Dr. Bary Likes: [phone]  - Dra. Annette Barters: [phone]  - Dr. Felipe Horton: [phone]   En caso de inclemencias del tiempo, por favor llame a Lajuan Pila principal al [phone] para una actualizacin sobre el [place] de cualquier retraso o cierre.  Consejos para la medicacin en dermatologa: Por favor, guarde las cajas en las que vienen los medicamentos de uso tpico para ayudarle a seguir las instrucciones sobre dnde y cmo usarlos. Las farmacias generalmente imprimen las instrucciones del medicamento slo en las cajas y no directamente en los tubos del [place].   Si su medicamento es muy caro, por favor, pngase en contacto con Bettyjane Brunet llamando al [phone] y presione la opcin 4 o envenos un  mensaje a travs de Clinical cytogeneticist.   No podemos decirle cul ser su copago por los medicamentos por adelantado ya que esto es diferente dependiendo de la cobertura de su seguro. Sin embargo, es posible que podamos encontrar un medicamento sustituto a Audiological scientist un formulario para que el seguro cubra el medicamento que se considera necesario.   Si se requiere una autorizacin previa para que su compaa de seguros [place] su medicamento, por favor permtanos de 1 a 2 das hbiles para completar este proceso.  Los precios de los medicamentos varan con frecuencia dependiendo del Environmental consultant de dnde se surte la receta y alguna farmacias pueden ofrecer precios ms baratos.  El sitio web www.goodrx.com tiene cupones para medicamentos de Health and safety inspector. Los precios aqu no tienen en cuenta lo que podra costar con la ayuda del seguro (puede ser ms barato con su seguro), pero el sitio web puede darle el precio si no utiliz Tourist information centre manager.  - Puede imprimir el cupn correspondiente  y llevarlo con su receta a la farmacia.  - Tambin puede pasar por nuestra oficina durante el horario de atencin regular y Education officer, museum una tarjeta de cupones de GoodRx.  - Si necesita que su receta se enve electrnicamente a una farmacia diferente, informe a nuestra oficina a travs de MyChart de [place] o por telfono llamando al [phone] y presione la opcin 4.   Please call our office at [phone] for any questions or concerns.

## 2023-07-23 NOTE — Progress Notes (Signed)
 Follow-Up Visit   Subjective  Brandy Oliver is a 83 y.o. 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   The patient has spots, moles and lesions to be evaluated, some may be new or changing and the patient may have concern these could be cancer.  Pt c/o irritated skin lesions on the chest/neck area.  The following portions of the chart were reviewed this encounter and updated as appropriate: medications, allergies, medical history  Review of Systems:  No other skin or systemic complaints except as noted in HPI or Assessment and Plan.  Objective  Well appearing patient in no apparent distress; mood and affect are within normal limits.   A focused examination was performed of the following areas: The    Relevant exam findings are noted in the Assessment and Plan.  R med ankle Pink biopsy site. neck and chest x 7 (7) Erythematous stuck-on, waxy papule or plaque L cheek x 1, R ear x 1 (2) Erythematous thin papules/macules with gritty scale.   Assessment & Plan  BASAL CELL CARCINOMA (BCC) OF SKIN OF RIGHT LOWER EXTREMITY INCLUDING HIP R med ankle Destruction of lesion Complexity: simple   Destruction method: cryotherapy   Informed consent: discussed and consent obtained   Timeout:  patient name, date of birth, surgical site, and procedure verified Lesion destroyed using liquid nitrogen: Yes   Region frozen until ice ball extended beyond lesion: Yes   Final wound size (cm):  3.5 Outcome: patient tolerated procedure well with no complications   Post-procedure details: wound care instructions given   Bx proven, discussed treatment options of no treatment vs surgery (not recommended) vs ED&C vs LN2 with topical 5FU.  Will tx with LN2 -treated today with LN2 and  5FU/Calcipotriene, in six weeks on June 1st, start 5FU/Calcipotriene (sent separately) to aa BID x 14  days.   INFLAMED SEBORRHEIC KERATOSIS (7) neck and chest x 7 (7) Symptomatic, irritating, patient would like treated.  Destruction of lesion - neck and chest x 7 (7) Complexity: simple   Destruction method: cryotherapy   Informed consent: discussed and consent obtained   Timeout:  patient name, date of birth, surgical site, and procedure verified Lesion destroyed using liquid nitrogen: Yes   Region frozen until ice ball extended beyond lesion: Yes   Outcome: patient tolerated procedure well with no complications   Post-procedure details: wound care instructions given   ACTINIC KERATOSIS (2) L cheek x 1, R ear x 1 (2) Actinic keratoses are precancerous spots that appear secondary to cumulative UV radiation exposure/sun exposure over time. They are chronic with expected duration over 1 year. A portion of actinic keratoses will progress to squamous cell carcinoma of the skin. It is not possible to reliably predict which spots will progress to skin cancer and so treatment is recommended to prevent development of skin cancer.  Recommend daily broad spectrum sunscreen SPF 30+ to sun-exposed areas, reapply every 2 hours as needed.  Recommend staying in the shade or wearing long sleeves, sun glasses (UVA+UVB protection) and wide brim hats (4-inch brim around the entire circumference of the hat). Call for new or changing lesions. Destruction of lesion - L cheek x 1, R ear x 1 (2) Complexity: simple   Destruction method: cryotherapy   Informed consent: discussed and consent obtained   Timeout:  patient name, date of birth, surgical site, and procedure verified Lesion destroyed  using liquid nitrogen: Yes   Region frozen until ice ball extended beyond lesion: Yes   Outcome: patient tolerated procedure well with no complications   Post-procedure details: wound care instructions given   ACTINIC SKIN DAMAGE   SEBORRHEIC KERATOSIS   CHEMOTHERAPY MANAGEMENT, ENCOUNTER FOR   COUNSELING AND  COORDINATION OF CARE   MEDICATION MANAGEMENT    ACTINIC DAMAGE - chronic, secondary to cumulative UV radiation exposure/sun exposure over time - diffuse scaly erythematous macules with underlying dyspigmentation - Recommend daily broad spectrum sunscreen SPF 30+ to sun-exposed areas, reapply every 2 hours as needed.  - Recommend staying in the shade or wearing long sleeves, sun glasses (UVA+UVB protection) and wide brim hats (4-inch brim around the entire circumference of the hat). - Call for new or changing lesions.  SEBORRHEIC KERATOSIS - Stuck-on, waxy, tan-brown papules and/or plaques  - Benign-appearing - Discussed benign etiology and prognosis. - Observe - Call for any changes  Return in 6 months (on 01/22/2024) for [facility] recheck R med ankle.  Arlinda Lais, CMA, am acting as scribe for Celine Collard, MD .  Documentation: I have reviewed the above documentation for accuracy and completeness, and I agree with the above.  Celine Collard, MD

## 2023-08-14 ENCOUNTER — Other Ambulatory Visit: Payer: Self-pay | Admitting: Surgery

## 2023-08-14 DIAGNOSIS — M79605 Pain in left leg: Secondary | ICD-10-CM

## 2023-08-14 DIAGNOSIS — M4807 Spinal stenosis, lumbosacral region: Secondary | ICD-10-CM

## 2023-08-19 ENCOUNTER — Ambulatory Visit
Admission: RE | Admit: 2023-08-19 | Discharge: 2023-08-19 | Disposition: A | Discharge status: Home / Self Care | Source: Ambulatory Visit | Attending: Surgery | Admitting: Surgery

## 2023-08-19 ENCOUNTER — Ambulatory Visit
Admission: RE | Admit: 2023-08-19 | Discharge: 2023-08-19 | Disposition: A | Discharge status: Home / Self Care | Source: Ambulatory Visit | Attending: Surgery

## 2023-08-19 DIAGNOSIS — M79605 Pain in left leg: Secondary | ICD-10-CM | POA: Diagnosis present

## 2023-08-19 DIAGNOSIS — M4807 Spinal stenosis, lumbosacral region: Secondary | ICD-10-CM | POA: Insufficient documentation

## 2023-08-20 ENCOUNTER — Ambulatory Visit: Admitting: Dermatology

## 2023-08-20 ENCOUNTER — Encounter: Payer: Self-pay | Admitting: Dermatology

## 2023-08-20 DIAGNOSIS — L97919 Non-pressure chronic ulcer of unspecified part of right lower leg with unspecified severity: Secondary | ICD-10-CM

## 2023-08-20 DIAGNOSIS — Z7189 Other specified counseling: Secondary | ICD-10-CM

## 2023-08-20 DIAGNOSIS — Z85828 Personal history of other malignant neoplasm of skin: Secondary | ICD-10-CM

## 2023-08-20 DIAGNOSIS — C44712 Basal cell carcinoma of skin of right lower limb, including hip: Secondary | ICD-10-CM

## 2023-08-20 DIAGNOSIS — L98491 Non-pressure chronic ulcer of skin of other sites limited to breakdown of skin: Secondary | ICD-10-CM

## 2023-08-20 DIAGNOSIS — Z79899 Other long term (current) drug therapy: Secondary | ICD-10-CM

## 2023-08-20 MED ORDER — MUPIROCIN 2 % EX OINT: 1.0000 | TOPICAL_OINTMENT | Freq: Every day | CUTANEOUS | 2 refills | Status: AC

## 2023-08-20 NOTE — Patient Instructions (Signed)
 Wound Care Instructions  1. Cleanse wound gently with soap and water once a day then pat dry with clean gauze. Apply a thin coat of Petrolatum (petroleum jelly, "Vaseline") over the wound (unless you have an allergy to this). We recommend that you use a new, sterile tube of Vaseline. Do not pick or remove scabs. Do not remove the yellow or [place] "healing tissue" from the base of the wound.  2. Cover the wound with fresh, clean, nonstick gauze and secure with paper tape. You may use Band-Aids in place of gauze and tape if the wound is small enough, but would recommend trimming much of the tape off as there is often too much. Sometimes Band-Aids can irritate the skin.  3. You should call the office for your biopsy report after 1 week if you have not already been contacted.  4. If you experience any problems, such as abnormal amounts of bleeding, swelling, significant bruising, significant pain, or evidence of infection, please call the office immediately.  5. FOR ADULT SURGERY PATIENTS: If you need something for pain relief you may take 1 extra strength Tylenol  (acetaminophen ) AND 2 Ibuprofen (200mg  each) together every 4 hours as needed for pain. (do not take these if you are allergic to them or if you have a reason you should not take them.) Typically, you may only need pain medication for 1 to 3 days.

## 2023-08-20 NOTE — Progress Notes (Addendum)
   Follow-Up Visit   Subjective  Brandy Oliver is a 82 y.o. female who presents for the following: recheck biopsy site on the right lower leg.  The patient has spots, moles and lesions to be evaluated, some may be new or changing and the patient may have concern these could be cancer.  The following portions of the chart were reviewed this encounter and updated as appropriate: medications, allergies, medical history  Review of Systems:  No other skin or systemic complaints except as noted in HPI or Assessment and Plan.  Objective  Well appearing patient in no apparent distress; mood and affect are within normal limits.  A focused examination was performed of the following areas: Right lower leg  Relevant exam findings are noted in the Assessment and Plan.   Assessment & Plan   BIOPSY PROVEN BASAL CELL CARCINOMA Pink / red crusted ulcer = healing biopsy/treatment site Right medial pretibial above ankle See PHOTO Continue wound care with mupirocin  ointment every one to three days.  D/C 5FU/Calcipotriene  compound and will recheck TX site in July.  May not need topical chemotherapy since LN2 treatment appears aggressive enough to eradicate BCC.  Wound cleansed, debrided mechanically (wound area is 6 cm squared) and dressed with Mupirocin ; Telfa and Wrap. Discussed wound care with Mupirocin  - sent Rx  Recheck 2-3 mos. 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  I, Lisbeth Rides, RMA, am acting as scribe for Celine Collard, MD.   Documentation: I have reviewed the above documentation for accuracy and completeness, and I agree with the above.  Celine Collard, MD

## 2023-08-21 ENCOUNTER — Ambulatory Visit: Payer: Self-pay | Admitting: Dermatology

## 2023-08-21 LAB — SURGICAL PATHOLOGY

## 2023-11-06 ENCOUNTER — Ambulatory Visit: Admitting: Dermatology

## 2023-11-06 ENCOUNTER — Encounter: Payer: Self-pay | Admitting: Dermatology

## 2023-11-06 DIAGNOSIS — Z85828 Personal history of other malignant neoplasm of skin: Secondary | ICD-10-CM

## 2023-11-06 DIAGNOSIS — W908XXA Exposure to other nonionizing radiation, initial encounter: Secondary | ICD-10-CM

## 2023-11-06 DIAGNOSIS — L820 Inflamed seborrheic keratosis: Secondary | ICD-10-CM | POA: Diagnosis not present

## 2023-11-06 DIAGNOSIS — L814 Other melanin hyperpigmentation: Secondary | ICD-10-CM

## 2023-11-06 DIAGNOSIS — L570 Actinic keratosis: Secondary | ICD-10-CM

## 2023-11-06 DIAGNOSIS — L821 Other seborrheic keratosis: Secondary | ICD-10-CM

## 2023-11-06 DIAGNOSIS — L578 Other skin changes due to chronic exposure to nonionizing radiation: Secondary | ICD-10-CM

## 2023-11-06 NOTE — Progress Notes (Unsigned)
 Follow-Up Visit   Subjective  Brandy Oliver is a 83 y.o. female who presents for the following:  Patient here for follow up on bx proven bcc at right medial pretibia treated with Ln2 in 07/23/2023 and then treated with 5FU/calcipotriene  cream twice daily for 14 days starting in June. Patient was seen in May regarding a follow up ulcer at site, wound was cleansed and debrided.  Hx of bcc, hx of isk and aks  Patient reports a spot near left wrist she would like checked.   The patient has spots, moles and lesions to be evaluated, some may be new or changing and the patient may have concern these could be cancer.  The following portions of the chart were reviewed this encounter and updated as appropriate: medications, allergies, medical history  Review of Systems:  No other skin or systemic complaints except as noted in HPI or Assessment and Plan.  Objective  Well appearing patient in no apparent distress; mood and affect are within normal limits.  A focused examination was performed of the following areas: Right leg, b/l arms, face   Relevant exam findings are noted in the Assessment and Plan.  left wrist x 1, right lower leg near bx site right medial pretibia x 1, right arm x 1 (3) Erythematous stuck-on, waxy papule or plaque nose x 5, left cheek x 3 (8) Erythematous thin papules/macules with gritty scale.   Assessment & Plan    SEBORRHEIC KERATOSIS - Stuck-on, waxy, tan-brown papules and/or plaques  - Benign-appearing - Discussed benign etiology and prognosis. - Observe - Call for any changes  LENTIGINES Exam: scattered tan macules Due to sun exposure Treatment Plan: Benign-appearing, observe. Recommend daily broad spectrum sunscreen SPF 30+ to sun-exposed areas, reapply every 2 hours as needed.  Call for any changes   ACTINIC DAMAGE - chronic, secondary to cumulative UV radiation exposure/sun exposure over time - diffuse scaly erythematous macules with underlying  dyspigmentation - Recommend daily broad spectrum sunscreen SPF 30+ to sun-exposed areas, reapply every 2 hours as needed.  - Recommend staying in the shade or wearing long sleeves, sun glasses (UVA+UVB protection) and wide brim hats (4-inch brim around the entire circumference of the hat). - Call for new or changing lesions.  HISTORY OF BASAL CELL CARCINOMA OF THE SKIN 06/12/2023 right medial ankle treated with LN2 07/23/2023  and treated with 5FU/Calcipotriene  June 1st 2025 bid for 14 days Clear today - 11/06/2023 - No evidence of recurrence today - Recommend regular full body skin exams - Recommend daily broad spectrum sunscreen SPF 30+ to sun-exposed areas, reapply every 2 hours as needed.  - Call if any new or changing lesions are noted between office visits  INFLAMED SEBORRHEIC KERATOSIS (3) left wrist x 1, right lower leg near bx site right medial pretibia x 1, right arm x 1 (3) Symptomatic, irritating, patient would like treated. Destruction of lesion - left wrist x 1, right lower leg near bx site right medial pretibia x 1, right arm x 1 (3) Complexity: simple   Destruction method: cryotherapy   Informed consent: discussed and consent obtained   Timeout:  patient name, date of birth, surgical site, and procedure verified Lesion destroyed using liquid nitrogen: Yes   Region frozen until ice ball extended beyond lesion: Yes   Outcome: patient tolerated procedure well with no complications   Post-procedure details: wound care instructions given    ACTINIC KERATOSIS (8) nose x 5, left cheek x 3 (8) Actinic keratoses are  precancerous spots that appear secondary to cumulative UV radiation exposure/sun exposure over time. They are chronic with expected duration over 1 year. A portion of actinic keratoses will progress to squamous cell carcinoma of the skin. It is not possible to reliably predict which spots will progress to skin cancer and so treatment is recommended to prevent development of  skin cancer.  Recommend daily broad spectrum sunscreen SPF 30+ to sun-exposed areas, reapply every 2 hours as needed.  Recommend staying in the shade or wearing long sleeves, sun glasses (UVA+UVB protection) and wide brim hats (4-inch brim around the entire circumference of the hat). Call for new or changing lesions. Destruction of lesion - nose x 5, left cheek x 3 (8) Complexity: simple   Destruction method: cryotherapy   Informed consent: discussed and consent obtained   Timeout:  patient name, date of birth, surgical site, and procedure verified Lesion destroyed using liquid nitrogen: Yes   Region frozen until ice ball extended beyond lesion: Yes   Outcome: patient tolerated procedure well with no complications   Post-procedure details: wound care instructions given     Return for keep follow up as schedule in March 2026 .  IEleanor Blush, CMA, am acting as scribe for Alm Rhyme, MD.   Documentation: I have reviewed the above documentation for accuracy and completeness, and I agree with the above.  Alm Rhyme, MD

## 2023-11-06 NOTE — Patient Instructions (Signed)
Actinic keratoses are precancerous spots that appear secondary to cumulative UV radiation exposure/sun exposure over time. They are chronic with expected duration over 1 year. A portion of actinic keratoses will progress to squamous cell carcinoma of the skin. It is not possible to reliably predict which spots will progress to skin cancer and so treatment is recommended to prevent development of skin cancer.  Recommend daily broad spectrum sunscreen SPF 30+ to sun-exposed areas, reapply every 2 hours as needed.  Recommend staying in the shade or wearing long sleeves, sun glasses (UVA+UVB protection) and wide brim hats (4-inch brim around the entire circumference of the hat). Call for new or changing lesions.     Cryotherapy Aftercare  Wash gently with soap and water everyday.   Apply Vaseline and Band-Aid daily until healed.     Seborrheic Keratosis  What causes seborrheic keratoses? Seborrheic keratoses are harmless, common skin growths that first appear during adult life.  As time goes by, more growths appear.  Some people may develop a large number of them.  Seborrheic keratoses appear on both covered and uncovered body parts.  They are not caused by sunlight.  The tendency to develop seborrheic keratoses can be inherited.  They vary in color from skin-colored to gray, brown, or even black.  They can be either smooth or have a rough, warty surface.   Seborrheic keratoses are superficial and look as if they were stuck on the skin.  Under the microscope this type of keratosis looks like layers upon layers of skin.  That is why at times the top layer may seem to fall off, but the rest of the growth remains and re-grows.    Treatment Seborrheic keratoses do not need to be treated, but can easily be removed in the office.  Seborrheic keratoses often cause symptoms when they rub on clothing or jewelry.  Lesions can be in the way of shaving.  If they become inflamed, they can cause itching,  soreness, or burning.  Removal of a seborrheic keratosis can be accomplished by freezing, burning, or surgery. If any spot bleeds, scabs, or grows rapidly, please return to have it checked, as these can be an indication of a skin cancer.    Due to recent changes in healthcare laws, you may see results of your pathology and/or laboratory studies on MyChart before the doctors have had a chance to review them. We understand that in some cases there may be results that are confusing or concerning to you. Please understand that not all results are received at the same time and often the doctors may need to interpret multiple results in order to provide you with the best plan of care or course of treatment. Therefore, we ask that you please give [place] 2 business days to thoroughly review all your results before contacting the office for clarification. Should we see a critical lab result, you will be contacted sooner.   If You Need Anything After Your Visit  If you have any questions or concerns for your doctor, please call our main line at [phone] and press option 4 to reach your doctor's medical assistant. If no one answers, please leave a voicemail as directed and we will return your call as soon as possible. Messages left after 4 pm will be answered the following business day.   You may also send [place] a message via MyChart. We typically respond to MyChart messages within 1-2 business days.  For prescription refills, please ask your pharmacy to contact  our office. Our fax number is [phone].  If you have an urgent issue when the clinic is closed that cannot wait until the next business day, you can page your doctor at the number below.    Please note that while we do our best to be available for urgent issues outside of office hours, we are not available 24/7.   If you have an urgent issue and are unable to reach [place], you may choose to seek medical care at your doctor's office, retail clinic,  urgent care center, or emergency room.  If you have a medical emergency, please immediately call 911 or go to the emergency department.  Pager Numbers  - Dr. Gwen Pounds: [phone]  - Dr. Roseanne Reno: [phone]  - Dr. Katrinka Blazing: [phone]   In the event of inclement weather, please call our main line at [phone] for an update on the status of any delays or closures.  Dermatology Medication Tips: Please keep the boxes that topical medications come in in order to help keep track of the instructions about where and how to use these. Pharmacies typically print the medication instructions only on the boxes and not directly on the medication tubes.   If your medication is too expensive, please contact our office at [phone] option 4 or send [place] a message through MyChart.   We are unable to tell what your co-pay for medications will be in advance as this is different depending on your insurance coverage. However, we may be able to find a substitute medication at lower cost or fill out paperwork to get insurance to cover a needed medication.   If a prior authorization is required to get your medication covered by your insurance company, please allow [place] 1-2 business days to complete this process.  Drug prices often vary depending on where the prescription is filled and some pharmacies may offer cheaper prices.  The website www.goodrx.com contains coupons for medications through different pharmacies. The prices here do not account for what the cost may be with help from insurance (it may be cheaper with your insurance), but the website can give you the price if you did not use any insurance.  - You can print the associated coupon and take it with your prescription to the pharmacy.  - You may also stop by our office during regular business hours and pick up a GoodRx coupon card.  - If you need your prescription sent electronically to a different pharmacy, notify our office through [facility] or by phone at [phone] option 4.     Si Usted Necesita Algo Despus de Su Visita  Tambin puede enviarnos un mensaje a travs de Clinical cytogeneticist. Por lo general respondemos a los mensajes de MyChart en el transcurso de 1 a 2 das hbiles.  Para renovar recetas, por favor pida a su farmacia que se ponga en contacto con nuestra oficina. Annie Sable de fax es [place] [phone].  Si tiene un asunto urgente cuando la clnica est cerrada y que no puede esperar hasta el siguiente da hbil, puede llamar/localizar a su doctor(a) al nmero que aparece a continuacin.   Por favor, tenga en cuenta que aunque hacemos todo lo posible para estar disponibles para asuntos urgentes fuera del horario de [place], no estamos disponibles las 24 horas del da, los 7 [address]  [address] de la [place].   Si tiene un problema urgente y no puede comunicarse con nosotros, puede optar por buscar atencin mdica  en el consultorio de su doctor(a), en una clnica  privada, en un centro de atencin urgente o en una sala de emergencias.  Si tiene Engineer, drilling, por favor llame inmediatamente al 911 o vaya a la sala de emergencias.  Nmeros de bper  - Dr. Gwen Pounds: [phone]  - Dra. Roseanne Reno: [phone]  - Dr. Katrinka Blazing: [phone]   En caso de inclemencias del tiempo, por favor llame a Lacy Duverney principal al [phone] para una actualizacin sobre el [place] de cualquier retraso o cierre.  Consejos para la medicacin en dermatologa: Por favor, guarde las cajas en las que vienen los medicamentos de uso tpico para ayudarle a seguir las instrucciones sobre dnde y cmo usarlos. Las farmacias generalmente imprimen las instrucciones del medicamento slo en las cajas y no directamente en los tubos del [place].   Si su medicamento es muy caro, por favor, pngase en contacto con Rolm Gala llamando al [phone] y presione la opcin 4 o envenos un mensaje a travs de Clinical cytogeneticist.   No podemos decirle cul  ser su copago por los medicamentos por adelantado ya que esto es diferente dependiendo de la cobertura de su seguro. Sin embargo, es posible que podamos encontrar un medicamento sustituto a Audiological scientist un formulario para que el seguro cubra el medicamento que se considera necesario.   Si se requiere una autorizacin previa para que su compaa de seguros [place] su medicamento, por favor permtanos de 1 a 2 das hbiles para completar [address]  Los precios de los medicamentos varan con frecuencia dependiendo del Environmental consultant de dnde se surte la receta y alguna farmacias pueden ofrecer precios ms baratos.  El sitio web www.goodrx.com tiene cupones para medicamentos de Health and safety inspector. Los precios aqu no tienen en cuenta lo que podra costar con la ayuda del seguro (puede ser ms barato con su seguro), pero el sitio web puede darle el precio si no utiliz Tourist information centre manager.  - Puede imprimir el cupn correspondiente y llevarlo con su receta a la farmacia.  - Tambin puede pasar por nuestra oficina durante el horario de atencin regular y Education officer, museum una tarjeta de cupones de GoodRx.  - Si necesita que su receta se enve electrnicamente a una farmacia diferente, informe a nuestra oficina a travs de MyChart de [place] o por telfono llamando al [phone] y presione la opcin 4.

## 2023-11-08 ENCOUNTER — Encounter: Payer: Self-pay | Admitting: Dermatology

## 2023-12-23 ENCOUNTER — Emergency Department

## 2023-12-23 ENCOUNTER — Emergency Department
Admission: EM | Admit: 2023-12-23 | Discharge: 2023-12-23 | Disposition: A | Discharge status: Home / Self Care | Attending: Emergency Medicine | Admitting: Emergency Medicine

## 2023-12-23 ENCOUNTER — Other Ambulatory Visit: Payer: Self-pay

## 2023-12-23 DIAGNOSIS — W19XXXA Unspecified fall, initial encounter: Secondary | ICD-10-CM

## 2023-12-23 DIAGNOSIS — S0990XA Unspecified injury of head, initial encounter: Secondary | ICD-10-CM | POA: Insufficient documentation

## 2023-12-23 DIAGNOSIS — N3000 Acute cystitis without hematuria: Secondary | ICD-10-CM | POA: Insufficient documentation

## 2023-12-23 DIAGNOSIS — W010XXA Fall on same level from slipping, tripping and stumbling without subsequent striking against object, initial encounter: Secondary | ICD-10-CM | POA: Insufficient documentation

## 2023-12-23 DIAGNOSIS — I10 Essential (primary) hypertension: Secondary | ICD-10-CM | POA: Diagnosis not present

## 2023-12-23 DIAGNOSIS — Z8673 Personal history of transient ischemic attack (TIA), and cerebral infarction without residual deficits: Secondary | ICD-10-CM | POA: Diagnosis not present

## 2023-12-23 DIAGNOSIS — I6782 Cerebral ischemia: Secondary | ICD-10-CM | POA: Insufficient documentation

## 2023-12-23 DIAGNOSIS — S32591A Other specified fracture of right pubis, initial encounter for closed fracture: Secondary | ICD-10-CM | POA: Diagnosis not present

## 2023-12-23 DIAGNOSIS — M25551 Pain in right hip: Secondary | ICD-10-CM | POA: Diagnosis present

## 2023-12-23 LAB — CBC
HCT: 40.8 % (ref 36.0–46.0)
Hemoglobin: 13.8 g/dL (ref 12.0–15.0)
MCH: 26.9 pg (ref 26.0–34.0)
MCHC: 33.8 g/dL (ref 30.0–36.0)
MCV: 79.5 fL — ABNORMAL LOW (ref 80.0–100.0)
Platelets: 188 K/uL (ref 150–400)
RBC: 5.13 MIL/uL — ABNORMAL HIGH (ref 3.87–5.11)
RDW: 13.2 % (ref 11.5–15.5)
WBC: 9.5 K/uL (ref 4.0–10.5)
nRBC: 0 % (ref 0.0–0.2)

## 2023-12-23 LAB — URINALYSIS, ROUTINE W REFLEX MICROSCOPIC
Bilirubin Urine: NEGATIVE
Glucose, UA: NEGATIVE mg/dL
Hgb urine dipstick: NEGATIVE
Ketones, ur: NEGATIVE mg/dL
Leukocytes,Ua: NEGATIVE
Nitrite: POSITIVE — AB
Protein, ur: NEGATIVE mg/dL
Specific Gravity, Urine: 1.006 (ref 1.005–1.030)
pH: 7 (ref 5.0–8.0)

## 2023-12-23 LAB — BASIC METABOLIC PANEL WITH GFR
Anion gap: 10 (ref 5–15)
BUN: 16 mg/dL (ref 8–23)
CO2: 29 mmol/L (ref 22–32)
Calcium: 9.3 mg/dL (ref 8.9–10.3)
Chloride: 98 mmol/L (ref 98–111)
Creatinine, Ser: 1.09 mg/dL — ABNORMAL HIGH (ref 0.44–1.00)
GFR, Estimated: 50 mL/min — ABNORMAL LOW (ref >60)
Glucose, Bld: 126 mg/dL — ABNORMAL HIGH (ref 70–99)
Potassium: 3.4 mmol/L — ABNORMAL LOW (ref 3.5–5.1)
Sodium: 137 mmol/L (ref 135–145)

## 2023-12-23 LAB — CBG MONITORING, ED: Glucose-Capillary: 106 mg/dL — ABNORMAL HIGH (ref 70–99)

## 2023-12-23 MED ORDER — OXYCODONE HCL 5 MG PO TABS
2.5000 mg | ORAL_TABLET | Freq: Once | ORAL | Status: AC
  Administered 2023-12-23: 2.5 mg via ORAL
  Filled 2023-12-23: qty 1

## 2023-12-23 MED ORDER — ACETAMINOPHEN 500 MG PO TABS
1000.0000 mg | ORAL_TABLET | Freq: Once | ORAL | Status: AC
  Administered 2023-12-23: 1000 mg via ORAL
  Filled 2023-12-23: qty 2

## 2023-12-23 MED ORDER — FOSFOMYCIN TROMETHAMINE 3 G PO PACK
3.0000 g | PACK | Freq: Once | ORAL | Status: AC
  Administered 2023-12-23: 3 g via ORAL
  Filled 2023-12-23: qty 3

## 2023-12-23 NOTE — ED Notes (Signed)
 Pt sating at 95-96% on RA.

## 2023-12-23 NOTE — ED Notes (Signed)
 To CT

## 2023-12-23 NOTE — ED Provider Notes (Signed)
 [facility] Provider Note    Event Date/Time   First MD Initiated Contact with Patient 12/23/23 1642     (approximate)   History   Fall   HPI  CLESSIE KARRAS is a 83 y.o. female with history of hypertension not on any blood thinners who comes in with concerns for a fall.  Patient reports that she was reaching something when she lost her balance and she fell over.  She denies any chest pain or shortness of breath prior to or after the fall.  She reports pain in the right hip.  She also hit her right shin.  She was given fentanyl  prior to arrival and desatted therefore was placed on 2 L.  She denies any shortness of breath prior to the injury.  Denies any chest pain, shortness of breath  Physical Exam   Triage Vital Signs: ED Triage Vitals  Encounter Vitals Group     BP 12/23/23 1444 (!) 160/113     Girls Systolic BP Percentile --      Girls Diastolic BP Percentile --      Boys Systolic BP Percentile --      Boys Diastolic BP Percentile --      Pulse Rate 12/23/23 1444 80     Resp 12/23/23 1444 18     Temp 12/23/23 1447 98 F (36.7 C)     Temp src --      SpO2 12/23/23 1444 (!) 88 %     Weight 12/23/23 1448 135 lb (61.2 kg)     Height 12/23/23 1448 5' 6.5 (1.689 m)     Head Circumference --      Peak Flow --      Pain Score 12/23/23 1448 8     Pain Loc --      Pain Education --      Exclude from Growth Chart --     Most recent vital signs: Vitals:   12/23/23 1700 12/23/23 1743  BP: (!) 166/94   Pulse: 92   Resp:    Temp:    SpO2:  96%     General: Awake, no distress.  CV:  Good peripheral perfusion.  Resp:  Normal effort.  No chest wall tenderness Abd:  No distention.  No abdominal tenderness Other:  Tenderness in the right hip with inability to lift it up good distal pulse able to flex and extend the ankle Bruising noted on her chin  ED Results / Procedures / Treatments   Labs (all labs ordered are listed, but only abnormal  results are displayed) Labs Reviewed  CBC - Abnormal; Notable for the following components:      Result Value   RBC 5.13 (*)    MCV 79.5 (*)    All other components within normal limits  BASIC METABOLIC PANEL WITH GFR - Abnormal; Notable for the following components:   Potassium 3.4 (*)    Glucose, Bld 126 (*)    Creatinine, Ser 1.09 (*)    GFR, Estimated 50 (*)    All other components within normal limits  URINALYSIS, ROUTINE W REFLEX MICROSCOPIC - Abnormal; Notable for the following components:   Color, Urine YELLOW (*)    APPearance CLEAR (*)    Nitrite POSITIVE (*)    Bacteria, UA FEW (*)    All other components within normal limits      RADIOLOGY I have reviewed the xray personally and interpreted no obvious right femur fracture   PROCEDURES:  Critical Care performed: No  Procedures   MEDICATIONS ORDERED IN ED: Medications  oxyCODONE  (Oxy IR/ROXICODONE ) immediate release tablet 2.5 mg (has no administration in time range)  acetaminophen  (TYLENOL ) tablet 1,000 mg (has no administration in time range)     IMPRESSION / MDM / ASSESSMENT AND PLAN / ED COURSE  I reviewed the triage vital signs and the nursing notes.   Patient's presentation is most consistent with acute presentation with potential threat to life or bodily function.   Patient comes in for mechanical fall.  Patient did get hypoxic after fentanyl .  Patient was taken off of the oxygen and had no hypoxia therefore I will give some oxycodone  to try to help with pain.  X-ray did not show obvious fracture but they were concerned about potentially an occult fracture and recommended CT imaging.  Given patient's age and she did hit her chin will get CT scans of the head and neck and face to evaluate for intercranial hemorrhage, cervical fracture, facial fracture.  Also I do not a chest x-ray just to ensure but patient has no pain with palpation over her chest.  Glucose reassuring urine with some positive nitrites  and few bacteria but no WBCs.  Her BMP reassuring CBC reassuring  IMPRESSION: 1. No CT evidence for acute intracranial abnormality. Atrophy and chronic small vessel ischemic changes of the white matter. Chronic thalamic infarcts. 2. Straightening of the cervical spine with degenerative changes. No acute osseous abnormality. 3. No acute facial bone fracture.  IMPRESSION: 1. Nondisplaced fracture of the inferior right pubic ramus. 2.  Aortic Atherosclerosis (ICD10-I70.0).  We discussed admission to the hospital if unable to ambulate or severe pain versus discharge home if able to ambulate with walker.  Patient is up and ambulatory with walker.  We discussed that these are nonoperative and pain control.  She expressed understanding and felt comfortable with discharge home.  She reports already having a prescription for tramadol also did not need anything else for pain.  She understands she can follow-up with orthopedics, and return to the ER for worsening symptoms or any other concerns.  Her urines did have some nitrite in it and family reported a's foul smell will send for culture and give a dose of fosfomycin      FINAL CLINICAL IMPRESSION(S) / ED DIAGNOSES   Final diagnoses:  Fall, initial encounter  Closed fracture of right inferior pubic ramus, initial encounter (HCC)  Acute cystitis without hematuria     Rx / DC Orders   ED Discharge Orders     None        Note:  This document was prepared using Dragon voice recognition software and may include unintentional dictation errors.   Ernest Ronal BRAVO, MD 12/23/23 [phone]

## 2023-12-23 NOTE — ED Triage Notes (Signed)
 Pt to ED [facility] from home for fall, right hip pain with possible rotation noted. Reports turned around quickly and lost balance, denies hitting head.  100mcg fentanyl  PTA 88 % on RA. Placed on 2 LNC

## 2023-12-23 NOTE — Discharge Instructions (Addendum)
 Return to the ER for fevers, worsening symptoms or any other concerns.  You can take Tylenol  1 g every 8 hours and use ibuprofen 400 if this is not working to help with any pain.  I would not take more than 800 mg of ibuprofen in a day due to you having a little bit of elevation of your kidney function.  You can use your tramadol for breakthrough pain but do not drive while on this.  Use your walker to help with ambulation.  Please call orthopedics to make a follow-up appointment.  We are covering you for a possible UTI with 1 dose of antibiotic here.  IMPRESSION: 1. Nondisplaced fracture of the inferior right pubic ramus. 2.  Aortic Atherosclerosis (ICD10-I70.0).

## 2023-12-25 LAB — URINE CULTURE: Culture: >=100000 — AB

## 2024-01-22 ENCOUNTER — Ambulatory Visit: Admitting: Dermatology

## 2024-06-11 ENCOUNTER — Ambulatory Visit: Admitting: Dermatology
